# Patient Record
Sex: Female | Born: 1960
Health system: Southern US, Community
[De-identification: ages and names within clinical notes are randomized; demographics above are authoritative.]

## PROBLEM LIST (undated history)

## (undated) DIAGNOSIS — K219 Gastro-esophageal reflux disease without esophagitis: Secondary | ICD-10-CM

## (undated) DIAGNOSIS — T7840XA Allergy, unspecified, initial encounter: Secondary | ICD-10-CM

## (undated) HISTORY — PX: UTERINE FIBROID SURGERY: SHX826

## (undated) HISTORY — DX: Allergy, unspecified, initial encounter: T78.40XA

---

## 1998-12-23 ENCOUNTER — Other Ambulatory Visit: Admission: RE | Admit: 1998-12-23 | Discharge: 1998-12-23 | Payer: Self-pay | Admitting: Gynecology

## 1999-12-10 ENCOUNTER — Other Ambulatory Visit: Admission: RE | Admit: 1999-12-10 | Discharge: 1999-12-10 | Payer: Self-pay | Admitting: Gynecology

## 2000-02-19 ENCOUNTER — Encounter: Admission: RE | Admit: 2000-02-19 | Discharge: 2000-02-19 | Payer: Self-pay | Admitting: Gynecology

## 2000-02-19 ENCOUNTER — Encounter: Payer: Self-pay | Admitting: Gynecology

## 2001-03-03 ENCOUNTER — Other Ambulatory Visit: Admission: RE | Admit: 2001-03-03 | Discharge: 2001-03-03 | Payer: Self-pay | Admitting: Gynecology

## 2001-04-21 ENCOUNTER — Encounter: Payer: Self-pay | Admitting: Gynecology

## 2001-04-21 ENCOUNTER — Ambulatory Visit (HOSPITAL_COMMUNITY): Admission: RE | Admit: 2001-04-21 | Discharge: 2001-04-21 | Payer: Self-pay | Admitting: Gynecology

## 2001-04-28 ENCOUNTER — Encounter: Payer: Self-pay | Admitting: Gynecology

## 2001-04-28 ENCOUNTER — Encounter: Admission: RE | Admit: 2001-04-28 | Discharge: 2001-04-28 | Payer: Self-pay | Admitting: Gynecology

## 2002-03-22 ENCOUNTER — Other Ambulatory Visit: Admission: RE | Admit: 2002-03-22 | Discharge: 2002-03-22 | Payer: Self-pay | Admitting: Gynecology

## 2002-05-11 ENCOUNTER — Encounter: Admission: RE | Admit: 2002-05-11 | Discharge: 2002-05-11 | Payer: Self-pay | Admitting: Gynecology

## 2002-05-11 ENCOUNTER — Encounter: Payer: Self-pay | Admitting: Gynecology

## 2003-05-02 ENCOUNTER — Other Ambulatory Visit: Admission: RE | Admit: 2003-05-02 | Discharge: 2003-05-02 | Payer: Self-pay | Admitting: Gynecology

## 2003-05-31 ENCOUNTER — Encounter: Payer: Self-pay | Admitting: Gynecology

## 2003-05-31 ENCOUNTER — Encounter: Admission: RE | Admit: 2003-05-31 | Discharge: 2003-05-31 | Payer: Self-pay | Admitting: Gynecology

## 2004-07-02 ENCOUNTER — Other Ambulatory Visit: Admission: RE | Admit: 2004-07-02 | Discharge: 2004-07-02 | Payer: Self-pay | Admitting: Gynecology

## 2004-08-14 ENCOUNTER — Encounter: Admission: RE | Admit: 2004-08-14 | Discharge: 2004-08-14 | Payer: Self-pay | Admitting: Gynecology

## 2005-10-15 ENCOUNTER — Encounter: Admission: RE | Admit: 2005-10-15 | Discharge: 2005-10-15 | Payer: Self-pay | Admitting: Gynecology

## 2005-11-25 ENCOUNTER — Other Ambulatory Visit: Admission: RE | Admit: 2005-11-25 | Discharge: 2005-11-25 | Payer: Self-pay | Admitting: Gynecology

## 2006-12-16 ENCOUNTER — Encounter: Admission: RE | Admit: 2006-12-16 | Discharge: 2006-12-16 | Payer: Self-pay | Admitting: Gynecology

## 2007-04-06 ENCOUNTER — Other Ambulatory Visit: Admission: RE | Admit: 2007-04-06 | Discharge: 2007-04-06 | Payer: Self-pay | Admitting: Gynecology

## 2008-02-22 ENCOUNTER — Encounter: Admission: RE | Admit: 2008-02-22 | Discharge: 2008-02-22 | Payer: Self-pay | Admitting: Gynecology

## 2008-06-13 ENCOUNTER — Other Ambulatory Visit: Admission: RE | Admit: 2008-06-13 | Discharge: 2008-06-13 | Payer: Self-pay | Admitting: Gynecology

## 2008-09-20 ENCOUNTER — Encounter: Admission: RE | Admit: 2008-09-20 | Discharge: 2008-09-20 | Payer: Self-pay | Admitting: Internal Medicine

## 2008-09-20 ENCOUNTER — Ambulatory Visit: Payer: Self-pay | Admitting: Internal Medicine

## 2008-10-03 ENCOUNTER — Ambulatory Visit (HOSPITAL_COMMUNITY): Admission: RE | Admit: 2008-10-03 | Discharge: 2008-10-03 | Payer: Self-pay | Admitting: Obstetrics and Gynecology

## 2008-10-03 ENCOUNTER — Encounter (INDEPENDENT_AMBULATORY_CARE_PROVIDER_SITE_OTHER): Payer: Self-pay | Admitting: Obstetrics and Gynecology

## 2009-02-28 ENCOUNTER — Encounter: Admission: RE | Admit: 2009-02-28 | Discharge: 2009-02-28 | Payer: Self-pay | Admitting: Obstetrics and Gynecology

## 2009-06-20 ENCOUNTER — Ambulatory Visit: Payer: Self-pay | Admitting: Internal Medicine

## 2009-09-06 ENCOUNTER — Ambulatory Visit: Payer: Self-pay | Admitting: Internal Medicine

## 2009-10-23 ENCOUNTER — Ambulatory Visit: Payer: Self-pay | Admitting: Internal Medicine

## 2010-03-06 ENCOUNTER — Encounter: Admission: RE | Admit: 2010-03-06 | Discharge: 2010-03-06 | Payer: Self-pay | Admitting: Obstetrics and Gynecology

## 2010-09-27 ENCOUNTER — Encounter: Payer: Self-pay | Admitting: Gynecology

## 2010-10-16 ENCOUNTER — Encounter: Payer: Self-pay | Admitting: Internal Medicine

## 2010-11-27 ENCOUNTER — Encounter (INDEPENDENT_AMBULATORY_CARE_PROVIDER_SITE_OTHER): Payer: BC Managed Care – PPO | Admitting: Internal Medicine

## 2010-11-27 DIAGNOSIS — Z0000 Encounter for general adult medical examination without abnormal findings: Secondary | ICD-10-CM

## 2010-12-04 ENCOUNTER — Other Ambulatory Visit: Payer: Self-pay | Admitting: Obstetrics and Gynecology

## 2010-12-21 LAB — URINALYSIS, ROUTINE W REFLEX MICROSCOPIC
Glucose, UA: NEGATIVE mg/dL
Ketones, ur: NEGATIVE mg/dL
Leukocytes, UA: NEGATIVE
Protein, ur: NEGATIVE mg/dL
Urobilinogen, UA: 0.2 mg/dL (ref 0.0–1.0)
pH: 6 (ref 5.0–8.0)

## 2010-12-21 LAB — PREGNANCY, URINE: Preg Test, Ur: NEGATIVE

## 2011-01-19 NOTE — Op Note (Signed)
Erin Fernandez, Erin Fernandez               ACCOUNT NO.:  [id number]   MEDICAL RECORD NO.:  [id number]          PATIENT TYPE:  AMB   LOCATION:  [facility]                           FACILITY:  [facility]   PHYSICIAN:  Randye Lobo, M.D.   DATE OF BIRTH:  1960/12/21   DATE OF PROCEDURE:  10/03/2008  DATE OF DISCHARGE:                               OPERATIVE REPORT   PREOPERATIVE DIAGNOSES:  1. Menorrhagia.  2. Intracavitary polyps versus fibroids.   POSTOPERATIVE DIAGNOSES:  1. Menorrhagia.  2. Polypoid endometrium.   PROCEDURE:  Hysteroscopy, dilation and curettage, endometrial  polypectomy, NovaSure endometrial ablation.   SURGEON:  Randye Lobo, MD   IV FLUIDS:  1400 mL of Ringer lactate.   ESTIMATED BLOOD LOSS:  Minimal.   <INTRACAVITARY RINGERS LACTATE DEFICIT>  90 cc.   COMPLICATIONS:  None.   INDICATIONS FOR PROCEDURE:  The patient is a 50 year old gravida 1, para  1, Caucasian female who presented with a complaint of heavy  menstruation.  The patient was referred by Dr. Teodora Medici, after he  performed a saline ultrasound in the office documenting 2 intracavitary  areas measuring 1.2 cm each which were consistent with either polyps or  submucous fibroids.  The patient underwent an endometrial biopsy on  July 22, 2008 in my office and this documented benign secretory  endometrium.  The patient desires removal of the intracavitary masses  and she also desires a NovaSure endometrial ablation and she declines  any future childbearing.  Risks, benefits, and alternatives of the  procedure have been reviewed with the patient, who wishes to proceed.   FINDINGS:  Examination under anesthesia revealed a small anteverted  mobile uterus.  No adnexal masses were appreciated.   Hysteroscopy demonstrated a very polypoid endometrium with several areas  that appeared to be potential intracavitary defects seen on the prior  saline ultrasound.  The right tubal ostial region was visualized and  the  left could not be visualized well.  The cervix demonstrated no polyps.   SPECIMENS:  The endometrial curettings and the polypoid endometrial  tissue were all sent to Pathology together.   PROCEDURE:  The patient was re-identified in the preoperative hold area.  She received ciprofloxacin 900 mg IV for antibiotic prophylaxis.   In the operating room, the patient received her LMA anesthetic and was  then placed in the dorsal lithotomy position.  The lower abdomen and  vagina and perineal region were then sterilely prepped.  The bladder was  catheterized of urine.  The patient was then sterilely draped.   An exam under anesthesia was performed.   A speculum was placed in the vagina and a single tooth tenaculum was  placed on the anterior cervical lip.  The internal os of the cervix was  sounded to 3.7 cm.  The uterine cavity was sounded to a total of 8.5 cm.  The cervix was then dilated up to a #21 Pratt dilator.  The diagnostic  hysteroscope was then inserted into the uterine cavity under the  continuous infusion of lactated Ringers solution.  The findings were as  noted  above.  The cervix was further dilated to a #25 Pratt dilator and  a serrated curette was used to curette the endometrial in all 4  quadrants.  The tissue specimen was removed with the serrated curette  and with the assistance of a polyp forceps.  The resectoscope was  reinserted and there was a large amount of tissue was remaining and the  cervix was therefore further dilated to a #31 Pratt dilator, so that the  resectoscope could be used.  The resectoscope was placed in the uterine  cavity without difficulty and the remaining polypoid endometrium was  resected using monopolar cautery on the resectoscopic loop.  The  remaining specimen pieces were then adequately removed and set aside for  pathologic evaluation.   The endometrial ablation was performed next.  The NovaSure device was  placed inside the uterine  cavity to the level of the uterine fundus and  withdrawn slightly.  The NovaSure was opened.  The NovaSure device was  maneuvered to appropriately measure the cavity width, which was 2.7 cm.  The obturator was placed against the cervical os and the CO2 test was  performed and the patient passed.  The NovaSure device was then  activated and the endometrium was ablated.  The NovaSure device was then  removed from within the uterine cavity.  There was no active bleeding  from the cervix after the tenaculum and the NovaSure device were  removed.   This concluded the patient's procedure.  She was awakened and escorted  to the recovery room in stable condition.  There were no complications.  All needle, instrument, and sponge counts were correct.      Randye Lobo, M.D.  Electronically Signed     BES/MEDQ  D:  10/03/2008  T:  10/04/2008  Job:  [id number]   cc:   Leatha Gilding. Mezer, M.D.  Fax: [phone]

## 2011-02-18 ENCOUNTER — Encounter: Payer: Self-pay | Admitting: Internal Medicine

## 2011-02-19 ENCOUNTER — Encounter: Payer: Self-pay | Admitting: Internal Medicine

## 2011-02-19 ENCOUNTER — Ambulatory Visit (INDEPENDENT_AMBULATORY_CARE_PROVIDER_SITE_OTHER): Payer: BC Managed Care – PPO | Admitting: Internal Medicine

## 2011-02-19 VITALS — BP 128/84 | HR 76 | Temp 98.4°F | Ht 66.0 in | Wt 133.0 lb

## 2011-02-19 DIAGNOSIS — R1013 Epigastric pain: Secondary | ICD-10-CM

## 2011-02-19 LAB — CBC WITH DIFFERENTIAL/PLATELET
Eosinophils Absolute: 0.2 10*3/uL (ref 0.0–0.7)
Eosinophils Relative: 4 % (ref 0–5)
HCT: 42.9 % (ref 36.0–46.0)
Hemoglobin: 14.7 g/dL (ref 12.0–15.0)
Lymphocytes Relative: 32 % (ref 12–46)
MCHC: 34.3 g/dL (ref 30.0–36.0)
Monocytes Absolute: 0.6 10*3/uL (ref 0.1–1.0)
Monocytes Relative: 9 % (ref 3–12)
Neutro Abs: 3.6 10*3/uL (ref 1.7–7.7)
RDW: 13.3 % (ref 11.5–15.5)

## 2011-02-19 NOTE — Patient Instructions (Signed)
Follow bland diet for several days. Avoid NSAIDS and ETOH for now. Take Protonix as directed. Plan colonoscopy (screening) for fall and possible endoscopy if needed. Await lab results.

## 2011-02-19 NOTE — Progress Notes (Signed)
  Subjective:    Patient ID: Erin Fernandez, female    DOB: [birth year], 50 y.o.   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 No melena, BRBPR, no hematemesis. No waterbrash or burping. Took Prilosec for about 10 days without much relief. Switched to Zantac 150 mg with better relief. 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 Some better this week. Has been avoiding alcohol lately. Nonsmoker. Pain noted to be worse after eating and as the day progressed. Feels pretty well in am. Not awakened with pain at night. Pt gives hx of gluten sensitivity but sprue panel was negative. Father has Ulcerative Colitis and she is due for a colonoscopy in the fall. No unusual stress.    Review of Systems     Objective:   Physical Exam  Abdominal: Soft. Bowel sounds are normal. She exhibits no distension and no mass. There is tenderness. There is no guarding.       Epigastrium without rebound.  Genitourinary: Guaiac negative stool.          Assessment & Plan:  Gastritis vs Peptic Ulcer disease. Plan is to check CBC and H.pylori antibody. Rx: Protonix 40mg  daily for 30 days if H. Pylori is negative. Avoid NSAIDS. Decrease ETOH consumption. Avoid eating late at night and spicy foods.

## 2011-02-24 ENCOUNTER — Encounter: Payer: Self-pay | Admitting: Internal Medicine

## 2011-02-25 LAB — HELICOBACTER PYLORI ABS-IGG+IGA, BLD: HELICOBACTER PYLORI AB, IGA: 6 U/mL (ref <9.0)

## 2011-05-26 ENCOUNTER — Other Ambulatory Visit: Payer: Self-pay | Admitting: Obstetrics and Gynecology

## 2011-05-26 DIAGNOSIS — Z1231 Encounter for screening mammogram for malignant neoplasm of breast: Secondary | ICD-10-CM

## 2011-06-25 ENCOUNTER — Ambulatory Visit
Admission: RE | Admit: 2011-06-25 | Discharge: 2011-06-25 | Disposition: A | Discharge status: Home / Self Care | Payer: BC Managed Care – PPO | Source: Ambulatory Visit | Attending: Obstetrics and Gynecology | Admitting: Obstetrics and Gynecology

## 2011-06-25 DIAGNOSIS — Z1231 Encounter for screening mammogram for malignant neoplasm of breast: Secondary | ICD-10-CM

## 2011-08-06 ENCOUNTER — Ambulatory Visit
Admission: RE | Admit: 2011-08-06 | Discharge: 2011-08-06 | Disposition: A | Discharge status: Home / Self Care | Payer: BC Managed Care – PPO | Source: Ambulatory Visit | Attending: Gastroenterology | Admitting: Gastroenterology

## 2011-08-06 ENCOUNTER — Other Ambulatory Visit: Payer: Self-pay | Admitting: Gastroenterology

## 2011-08-06 DIAGNOSIS — R1013 Epigastric pain: Secondary | ICD-10-CM

## 2011-08-18 ENCOUNTER — Other Ambulatory Visit: Payer: Self-pay | Admitting: Internal Medicine

## 2011-08-18 DIAGNOSIS — N2889 Other specified disorders of kidney and ureter: Secondary | ICD-10-CM

## 2011-08-18 DIAGNOSIS — R1013 Epigastric pain: Secondary | ICD-10-CM

## 2011-08-18 NOTE — Progress Notes (Signed)
Contacted by patient today who has been having abdominal pain evaluated by Dr. Kinnie Scales. A right renal mass was noted on ultrasound in May measuring 5x4x23mm. Repeat study 08/06/11 shows same mass hypoechoic measuring 9x6x50mm. Radiologist mentions possibility of renal cell carcinoma and recommends MRI with and without contrast. Attempts schedule MRI of the abdomen with and without contrast for further evaluation. Patient is quite worried. Recent urinalysis showed 3-6 red blood cells per high powered field. CBC was normal. We do not have a recent basic metabolic panel with kidney functions for contrast administration

## 2011-08-19 ENCOUNTER — Other Ambulatory Visit: Payer: BC Managed Care – PPO | Admitting: Internal Medicine

## 2011-08-19 DIAGNOSIS — R1013 Epigastric pain: Secondary | ICD-10-CM

## 2011-08-20 ENCOUNTER — Ambulatory Visit (HOSPITAL_COMMUNITY)
Admission: RE | Admit: 2011-08-20 | Discharge: 2011-08-20 | Disposition: A | Discharge status: Home / Self Care | Payer: BC Managed Care – PPO | Source: Ambulatory Visit | Attending: Internal Medicine | Admitting: Internal Medicine

## 2011-08-20 DIAGNOSIS — N281 Cyst of kidney, acquired: Secondary | ICD-10-CM | POA: Insufficient documentation

## 2011-08-20 DIAGNOSIS — N2889 Other specified disorders of kidney and ureter: Secondary | ICD-10-CM

## 2011-08-20 DIAGNOSIS — R9389 Abnormal findings on diagnostic imaging of other specified body structures: Secondary | ICD-10-CM | POA: Insufficient documentation

## 2011-08-20 LAB — COMPLETE METABOLIC PANEL WITH GFR
ALT: 8 U/L (ref 0–35)
AST: 13 U/L (ref 0–37)
CO2: 22 mEq/L (ref 19–32)
Creat: 0.66 mg/dL (ref 0.50–1.10)
GFR, Est Non African American: >89 mL/min
Glucose, Bld: 80 mg/dL (ref 70–99)
Potassium: 4.1 mEq/L (ref 3.5–5.3)
Sodium: 141 mEq/L (ref 135–145)
Total Bilirubin: 0.6 mg/dL (ref 0.3–1.2)
Total Protein: 6.7 g/dL (ref 6.0–8.3)

## 2011-08-20 MED ORDER — GADOBENATE DIMEGLUMINE 529 MG/ML IV SOLN
12.0000 mL | Freq: Once | INTRAVENOUS | Status: AC | PRN
  Administered 2011-08-20: 12 mL via INTRAVENOUS

## 2011-08-21 ENCOUNTER — Telehealth: Payer: Self-pay | Admitting: Internal Medicine

## 2011-08-21 NOTE — Telephone Encounter (Signed)
MRI shows benign lesion in kidney. Pt informed today via phone. Copy of report mailed to pt also.

## 2012-06-20 ENCOUNTER — Other Ambulatory Visit: Payer: Self-pay | Admitting: Internal Medicine

## 2012-06-20 DIAGNOSIS — Z1231 Encounter for screening mammogram for malignant neoplasm of breast: Secondary | ICD-10-CM

## 2012-07-21 ENCOUNTER — Ambulatory Visit
Admission: RE | Admit: 2012-07-21 | Discharge: 2012-07-21 | Disposition: A | Discharge status: Home / Self Care | Payer: BC Managed Care – PPO | Source: Ambulatory Visit | Attending: Internal Medicine | Admitting: Internal Medicine

## 2012-07-21 DIAGNOSIS — Z1231 Encounter for screening mammogram for malignant neoplasm of breast: Secondary | ICD-10-CM

## 2012-08-13 ENCOUNTER — Encounter (HOSPITAL_COMMUNITY): Payer: Self-pay | Admitting: *Deleted

## 2012-08-13 ENCOUNTER — Emergency Department (HOSPITAL_COMMUNITY)
Admission: EM | Admit: 2012-08-13 | Discharge: 2012-08-13 | Disposition: A | Discharge status: Home / Self Care | Payer: BC Managed Care – PPO | Source: Home / Self Care | Attending: Emergency Medicine | Admitting: Emergency Medicine

## 2012-08-13 DIAGNOSIS — T148XXA Other injury of unspecified body region, initial encounter: Secondary | ICD-10-CM

## 2012-08-13 DIAGNOSIS — IMO0002 Reserved for concepts with insufficient information to code with codable children: Secondary | ICD-10-CM

## 2012-08-13 DIAGNOSIS — Z23 Encounter for immunization: Secondary | ICD-10-CM

## 2012-08-13 HISTORY — DX: Gastro-esophageal reflux disease without esophagitis: K21.9

## 2012-08-13 MED ORDER — TETANUS-DIPHTH-ACELL PERTUSSIS 5-2.5-18.5 LF-MCG/0.5 IM SUSP
INTRAMUSCULAR | Status: AC
  Filled 2012-08-13: qty 0.5

## 2012-08-13 MED ORDER — BACITRACIN 500 UNIT/GM EX OINT
1.0000 "application " | TOPICAL_OINTMENT | Freq: Once | CUTANEOUS | Status: AC
  Administered 2012-08-13: 1 via TOPICAL

## 2012-08-13 MED ORDER — TETANUS-DIPHTH-ACELL PERTUSSIS 5-2.5-18.5 LF-MCG/0.5 IM SUSP
0.5000 mL | Freq: Once | INTRAMUSCULAR | Status: AC
  Administered 2012-08-13: 0.5 mL via INTRAMUSCULAR

## 2012-08-13 NOTE — ED Notes (Signed)
Reports laceration to hand between right 4th & 5th fingers @ approx 1730 tonight from broken mason jar edge.  No active bleeding.  Took 600mg  Advil - denies any pain now.

## 2012-08-13 NOTE — ED Provider Notes (Signed)
Chief Complaint  Patient presents with  . 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 Bleeding was controlled. There is no numbness or tingling. She has a full range of motion of all joints. Her last tetanus shot was about 6 years ago, but there may have been some rust on the ring of the jelly jar. She does not think there is any glass foreign body in the wound.  Review of Systems:  Other than noted above, the patient denies any of the following symptoms: Systemic:  No fever or chills. Musculoskeletal:  No joint pain or decreased range of motion. Neuro:  No numbness, tingling, or weakness.  PMFSH:  Past medical history, family history, social history, meds, and allergies were reviewed.  Physical Exam:   Vital signs:  BP 151/89  Pulse 72  Temp 98.7 F (37.1 C) (Oral)  Resp 16  SpO2 98% Ext:  There is a 1 cm laceration in the webspace between the little finger and the ring finger of the right hand. All joints have full range of motion. Sensation is intact. Flexor and extensor mechanisms are intact.  All joints had a full ROM without pain.  Pulses were full.  Good capillary refill in all digits.  No edema. Neurological:  Alert and oriented.  No muscle weakness.  Sensation was intact to light touch.   Procedure: Verbal informed consent was obtained.  The patient was informed of the risks and benefits of the procedure and understands and accepts.  Identity of the patient was verified verbally and by wristband.   The laceration area described above was prepped with Betadine and saline  and anesthetized with 3 mL of 2% Xylocaine without.  The wound was then closed as follows:  Wound edges were approximated with 4 6-0 Prolene sutures.  There were no immediate complications, and the patient tolerated the procedure  well. The laceration was then cleansed, Bacitracin ointment was applied and a clean, dry pressure dressing was put on.   Medications given in [facility]:  She was given a Tdap vaccine.  Assessment:  The encounter diagnosis was Laceration.  Plan:   1.  The following meds were prescribed:   New Prescriptions   No medications on file   2.  The patient was instructed in wound care and pain control, and handouts were given. 3.  The patient was told to return in 14 days for suture removal or wound recheck or sooner if any sign of infection.     Reuben Likes, MD 08/13/12 2018

## 2013-04-13 ENCOUNTER — Encounter: Payer: BC Managed Care – PPO | Admitting: Internal Medicine

## 2013-04-13 ENCOUNTER — Other Ambulatory Visit: Payer: Self-pay | Admitting: Internal Medicine

## 2013-06-08 ENCOUNTER — Encounter: Payer: Self-pay | Admitting: Internal Medicine

## 2013-06-08 ENCOUNTER — Other Ambulatory Visit: Payer: BC Managed Care – PPO | Admitting: Internal Medicine

## 2013-06-08 ENCOUNTER — Ambulatory Visit (INDEPENDENT_AMBULATORY_CARE_PROVIDER_SITE_OTHER): Payer: BC Managed Care – PPO | Admitting: Internal Medicine

## 2013-06-08 ENCOUNTER — Other Ambulatory Visit (HOSPITAL_COMMUNITY)
Admission: RE | Admit: 2013-06-08 | Discharge: 2013-06-08 | Disposition: A | Discharge status: Home / Self Care | Payer: BC Managed Care – PPO | Source: Ambulatory Visit | Attending: Internal Medicine | Admitting: Internal Medicine

## 2013-06-08 VITALS — BP 136/90 | HR 72 | Temp 99.1°F | Ht 66.25 in | Wt 134.0 lb

## 2013-06-08 DIAGNOSIS — Z1329 Encounter for screening for other suspected endocrine disorder: Secondary | ICD-10-CM

## 2013-06-08 DIAGNOSIS — Z23 Encounter for immunization: Secondary | ICD-10-CM

## 2013-06-08 DIAGNOSIS — Z01419 Encounter for gynecological examination (general) (routine) without abnormal findings: Secondary | ICD-10-CM | POA: Insufficient documentation

## 2013-06-08 DIAGNOSIS — Z0000 Encounter for general adult medical examination without abnormal findings: Secondary | ICD-10-CM

## 2013-06-08 DIAGNOSIS — Z13220 Encounter for screening for lipoid disorders: Secondary | ICD-10-CM

## 2013-06-08 DIAGNOSIS — K219 Gastro-esophageal reflux disease without esophagitis: Secondary | ICD-10-CM

## 2013-06-08 DIAGNOSIS — Z130 Encounter for screening for diseases of the blood and blood-forming organs and certain disorders involving the immune mechanism: Secondary | ICD-10-CM

## 2013-06-08 LAB — CBC WITH DIFFERENTIAL/PLATELET
Eosinophils Absolute: 0.2 10*3/uL (ref 0.0–0.7)
Hemoglobin: 14.1 g/dL (ref 12.0–15.0)
MCH: 30.9 pg (ref 26.0–34.0)
Monocytes Relative: 9 % (ref 3–12)
Neutro Abs: 3.5 10*3/uL (ref 1.7–7.7)
Neutrophils Relative %: 56 % (ref 43–77)
Platelets: 220 10*3/uL (ref 150–400)
RBC: 4.57 MIL/uL (ref 3.87–5.11)
RDW: 13.7 % (ref 11.5–15.5)

## 2013-06-08 LAB — POCT URINALYSIS DIPSTICK
Glucose, UA: NEGATIVE
Ketones, UA: NEGATIVE
Protein, UA: NEGATIVE
Spec Grav, UA: <=1.005
Urobilinogen, UA: NEGATIVE

## 2013-06-08 LAB — COMPREHENSIVE METABOLIC PANEL
ALT: 9 U/L (ref 0–35)
AST: 14 U/L (ref 0–37)
Albumin: 4.2 g/dL (ref 3.5–5.2)
Glucose, Bld: 79 mg/dL (ref 70–99)
Potassium: 4.2 mEq/L (ref 3.5–5.3)
Sodium: 137 mEq/L (ref 135–145)

## 2013-06-08 LAB — LIPID PANEL
Cholesterol: 174 mg/dL (ref 0–200)
Triglycerides: 76 mg/dL (ref <150)

## 2013-06-08 LAB — TSH: TSH: 1.552 u[IU]/mL (ref 0.350–4.500)

## 2013-06-08 NOTE — Patient Instructions (Addendum)
Return in one year. May take Nexium OTC 2 capsules daily.

## 2013-06-09 LAB — URINALYSIS, ROUTINE W REFLEX MICROSCOPIC
Bilirubin Urine: NEGATIVE
Glucose, UA: NEGATIVE mg/dL
Leukocytes, UA: NEGATIVE
Protein, ur: NEGATIVE mg/dL
Urobilinogen, UA: 0.2 mg/dL (ref 0.0–1.0)
pH: 7 (ref 5.0–8.0)

## 2013-06-09 LAB — VITAMIN D 25 HYDROXY (VIT D DEFICIENCY, FRACTURES): Vit D, 25-Hydroxy: 52 ng/mL (ref 30–89)

## 2013-06-09 LAB — URINALYSIS, MICROSCOPIC ONLY
Casts: NONE SEEN
Squamous Epithelial / LPF: NONE SEEN

## 2013-07-07 NOTE — Progress Notes (Signed)
  Subjective:    Patient ID: Erin Fernandez, female    DOB: 1960/12/22, 52 y.o.   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 History of GE reflux. Dr. Kinnie Scales did colonoscopy 06/03/2011. Study was normal and followup was recommended in 10 years. She also had endoscopy at the same time for refractory reflux. Patient had complained previously that Protonix caused palpitations. Dr. Kinnie Scales recommended AcipHex. More recently patient has been taking 24-hour over-the-counter Nexium. Biopsies obtained by Dr. Kinnie Scales showed minimal focal active colitis. Duodenal biopsy showed mild chronic duodenitis. No evidence of sprue. Rectal biopsy showed benign colonic mucosa. In may 2012 she had renal ultrasound and a hyperechoic lesion noted in the right kidney was noted. 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 She was evaluated at the time by Dr. Patsi Sears for microhematuria.  Past medical history: Patient is allergic penicillin and sulfa-both cause rashes.  Patient had endometrial polypectomy 2010.  Nonsmoker. Social alcohol consumption.  Social history: Building surveyor. One daughter from previous marriage who is in Social worker school in [place]. This is her second marriage. She is married to Bubba Camp, DDS.  Family history: father and brother with hypertension. Father with history of colitis and pacemaker. Mother in good health. Sister in good health.    Review of Systems  Constitutional: Negative.   All other systems reviewed and are negative.       Objective:   Physical Exam  Vitals reviewed. Constitutional: She is oriented to person, place, and time. She appears well-developed and well-nourished. No distress.  HENT:  Head: Normocephalic and atraumatic.  Right Ear: External ear normal.  Left Ear: External ear normal.  Nose: Nose normal.  Mouth/Throat: Oropharynx  is clear and moist. No oropharyngeal exudate.  Eyes: Conjunctivae and EOM are normal. Right eye exhibits no discharge. Left eye exhibits no discharge.  Neck: Neck supple. No JVD present. No thyromegaly present.  Cardiovascular: Normal rate, regular rhythm, normal heart sounds and intact distal pulses.   No murmur heard. Pulmonary/Chest: Effort normal and breath sounds normal. She has no wheezes. She has no rales.  Breasts normal female  Abdominal: Soft. Bowel sounds are normal. She exhibits no distension and no mass. There is no tenderness. There is no rebound and no guarding.  Genitourinary:  Bimanual normal. Pap taken.  Musculoskeletal: Normal range of motion. She exhibits no edema.  Lymphadenopathy:    She has no cervical adenopathy.  Neurological: She is alert and oriented to person, place, and time. She has normal reflexes. No cranial nerve deficit. Coordination normal.  Skin: Skin is warm and dry. No rash noted. She is not diaphoretic.  Psychiatric: She has a normal mood and affect. Her behavior is normal. Judgment and thought content normal.          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 Patsi Sears  History of minimal focal active colitis of cecum 2012 currently not being treated  History of duodenitis with biopsy 2012  Plan: Return in one year or as needed. Continue over-the-counter Nexium for GE reflux symptoms.

## 2013-07-23 ENCOUNTER — Other Ambulatory Visit: Payer: Self-pay

## 2013-07-23 DIAGNOSIS — Z1231 Encounter for screening mammogram for malignant neoplasm of breast: Secondary | ICD-10-CM

## 2013-08-24 ENCOUNTER — Ambulatory Visit
Admission: RE | Admit: 2013-08-24 | Discharge: 2013-08-24 | Disposition: A | Discharge status: Home / Self Care | Payer: BC Managed Care – PPO | Source: Ambulatory Visit

## 2013-08-24 DIAGNOSIS — Z1231 Encounter for screening mammogram for malignant neoplasm of breast: Secondary | ICD-10-CM

## 2013-10-11 ENCOUNTER — Ambulatory Visit (INDEPENDENT_AMBULATORY_CARE_PROVIDER_SITE_OTHER): Payer: BC Managed Care – PPO | Admitting: Internal Medicine

## 2013-10-11 ENCOUNTER — Encounter: Payer: Self-pay | Admitting: Internal Medicine

## 2013-10-11 VITALS — BP 134/88 | HR 72 | Temp 98.8°F | Wt 135.0 lb

## 2013-10-11 DIAGNOSIS — IMO0001 Reserved for inherently not codable concepts without codable children: Secondary | ICD-10-CM

## 2013-10-11 DIAGNOSIS — R2230 Localized swelling, mass and lump, unspecified upper limb: Secondary | ICD-10-CM

## 2013-10-11 DIAGNOSIS — R229 Localized swelling, mass and lump, unspecified: Secondary | ICD-10-CM

## 2013-10-11 NOTE — Progress Notes (Signed)
   Subjective:    Patient ID: Erin Fernandez, female    DOB: Nov 10, 1960, 53 y.o.   MRN: [id number]  HPI Patient had normal mammogram November 2014. Over the past few weeks has noticed an oblong mass left ankle elevated is tender. No recent illnesses. Patient is worried and concerned.    Review of Systems     Objective:   Physical Exam  1.5 cm oblong mass lower left axilla. She has thickening in left upper outer quadrant of left breast. Says that this is chronic.      Assessment & Plan:  Axillary mass  Plan: Refer to surgeon for evaluation.

## 2013-10-11 NOTE — Patient Instructions (Signed)
Refer to surgeon for evaluation 

## 2013-10-12 ENCOUNTER — Telehealth (INDEPENDENT_AMBULATORY_CARE_PROVIDER_SITE_OTHER): Payer: Self-pay

## 2013-10-12 ENCOUNTER — Ambulatory Visit (INDEPENDENT_AMBULATORY_CARE_PROVIDER_SITE_OTHER): Payer: BC Managed Care – PPO | Admitting: General Surgery

## 2013-10-12 ENCOUNTER — Encounter (INDEPENDENT_AMBULATORY_CARE_PROVIDER_SITE_OTHER): Payer: Self-pay | Admitting: General Surgery

## 2013-10-12 VITALS — BP 124/70 | HR 77 | Temp 98.0°F | Resp 18 | Ht 66.0 in | Wt 135.0 lb

## 2013-10-12 DIAGNOSIS — R229 Localized swelling, mass and lump, unspecified: Secondary | ICD-10-CM

## 2013-10-12 DIAGNOSIS — R2230 Localized swelling, mass and lump, unspecified upper limb: Secondary | ICD-10-CM

## 2013-10-12 NOTE — Telephone Encounter (Signed)
Patient called back and was given below message.  Patient states understanding. 

## 2013-10-12 NOTE — Progress Notes (Signed)
Patient ID: Erin Fernandez, female   DOB: 05/07/1961, 53 y.o.   MRN: [id number]  Chief Complaint  Patient presents with  . New Evaluation    Left axillia    HPI Erin Fernandez is a 53 y.o. female.   HPI Patient is a 54 year old female who has palpated a mass in her left axilla around 3-4 days ago. She states that this has been quite sore.  She denies any trauma to her upper extremity. She has no history of breast cancer. She denies any breast complaints. She did have her routine screening mammogram in November which was negative. She denies any drainage or redness. She states the pain is noticeable, but not horrible.  She had a bad cold over the Christmas holiday, but has felt well for quite some time.  Past Medical History  Diagnosis Date  . GERD (gastroesophageal reflux disease)     Past Surgical History  Procedure Laterality Date  . Uterine fibroid surgery      Family History  Problem Relation Age of Onset  . Hypertension Father   . Cholecystitis Father   . Hypertension Brother     Social History History  Substance Use Topics  . Smoking status: Never Smoker   . Smokeless tobacco: Never Used  . Alcohol Use: 0.0 oz/week     Comment: wine on weekends    Allergies  Allergen Reactions  . Penicillins Rash  . Sulfa Antibiotics Rash    Current Outpatient Prescriptions  Medication Sig Dispense Refill  . Calcium Citrate (CITRACAL PO) Take by mouth daily.        . Cholecalciferol (VITAMIN D PO) Take by mouth daily.        Marland Kitchen esomeprazole (NEXIUM) 20 MG capsule Take 20 mg by mouth daily at 12 noon.      . Esomeprazole Magnesium (NEXIUM 24HR PO) Take by mouth.      . Omega-3 Fatty Acids (OMEGA 3 PO) Take by mouth daily.         No current facility-administered medications for this visit.    Review of Systems Review of Systems  All other systems reviewed and are negative.    Blood pressure 124/70, pulse 77, temperature 98 F (36.7 C), resp. rate 18, height 5\' 6"   (1.676 m), weight 135 lb (61.236 kg).  Physical Exam Physical Exam  Constitutional: She is oriented to person, place, and time. She appears well-developed and well-nourished. No distress.  HENT:  Head: Normocephalic and atraumatic.  Eyes: Conjunctivae are normal. Pupils are equal, round, and reactive to light. Right eye exhibits no discharge. Left eye exhibits no discharge. No scleral icterus.  Neck: Normal range of motion. Neck supple. No JVD present. No tracheal deviation present. No thyromegaly present.  Cardiovascular: Normal rate, regular rhythm and intact distal pulses.   Pulmonary/Chest: Effort normal. No respiratory distress. She exhibits mass. Right breast exhibits no inverted nipple, no mass, no nipple discharge, no skin change and no tenderness. Left breast exhibits no inverted nipple, no mass, no nipple discharge, no skin change and no tenderness.    Small oval firm mass just behind the edge of the pectoralis on the left.  No other axillary adenopathy.  No palpable breast masses.  Relatively dense breast tissue bilaterally.    Abdominal: Soft. She exhibits no distension. There is no tenderness.  Lymphadenopathy:    She has no cervical adenopathy.  Neurological: She is alert and oriented to person, place, and time.  Skin: Skin is warm and dry.  No rash noted. She is not diaphoretic. No erythema. No pallor.  Psychiatric: She has a normal mood and affect. Her behavior is normal. Judgment and thought content normal.    Data Reviewed Mammogram IMPRESSION:  No mammographic evidence of malignancy. A result letter of this  screening mammogram will be mailed directly to the patient.   Assessment/Plan    Left Axillary mass This axillary mass feels like a small reactive lymph node.  I will get a diagnostic mammogram and ultrasound to evaluate this. If this does look like a benign reactive lymph node, I will still recheck her in 6-8 weeks to see if this has disappeared. It may  require surgical excision if it is not going on its own.  Once we get the results of the films, we will determine what time and to follow her up.     Meko Bellanger 10/12/2013, 1:32 PM

## 2013-10-12 NOTE — Assessment & Plan Note (Signed)
This axillary mass feels like a small reactive lymph node.  I will get a diagnostic mammogram and ultrasound to evaluate this. If this does look like a benign reactive lymph node, I will still recheck her in 6-8 weeks to see if this has disappeared. It may require surgical excision if it is not going on its own.  Once we get the results of the films, we will determine what time and to follow her up.

## 2013-10-12 NOTE — Patient Instructions (Signed)
We will get diagnostic mammogram and ultrasound.  Once we get result, will determine which direction to go in.  Even if it does not look suspicious, would recheck in 6-8 weeks.

## 2013-10-12 NOTE — Telephone Encounter (Signed)
Pt has mammogram and US scheduled at [facility] 11/01/13 at 3:15 p.m.

## 2013-11-01 ENCOUNTER — Other Ambulatory Visit: Payer: BC Managed Care – PPO

## 2013-11-08 ENCOUNTER — Ambulatory Visit
Admission: RE | Admit: 2013-11-08 | Discharge: 2013-11-08 | Disposition: A | Discharge status: Home / Self Care | Payer: BC Managed Care – PPO | Source: Ambulatory Visit | Attending: General Surgery | Admitting: General Surgery

## 2013-11-08 ENCOUNTER — Other Ambulatory Visit (INDEPENDENT_AMBULATORY_CARE_PROVIDER_SITE_OTHER): Payer: Self-pay | Admitting: General Surgery

## 2013-11-08 ENCOUNTER — Ambulatory Visit (INDEPENDENT_AMBULATORY_CARE_PROVIDER_SITE_OTHER): Payer: BC Managed Care – PPO | Admitting: Internal Medicine

## 2013-11-08 ENCOUNTER — Encounter: Payer: Self-pay | Admitting: Internal Medicine

## 2013-11-08 VITALS — BP 122/80 | HR 64 | Temp 98.9°F | Wt 135.5 lb

## 2013-11-08 DIAGNOSIS — H6503 Acute serous otitis media, bilateral: Secondary | ICD-10-CM

## 2013-11-08 DIAGNOSIS — R2230 Localized swelling, mass and lump, unspecified upper limb: Secondary | ICD-10-CM

## 2013-11-08 DIAGNOSIS — H6500 Acute serous otitis media, unspecified ear: Secondary | ICD-10-CM

## 2013-11-08 DIAGNOSIS — J019 Acute sinusitis, unspecified: Secondary | ICD-10-CM

## 2013-11-08 MED ORDER — HYDROCODONE-HOMATROPINE 5-1.5 MG/5ML PO SYRP: 5.0000 mL | ORAL_SOLUTION | Freq: Three times a day (TID) | ORAL | Status: DC | PRN

## 2013-11-08 MED ORDER — CLARITHROMYCIN 250 MG PO TABS: 250.0000 mg | ORAL_TABLET | Freq: Two times a day (BID) | ORAL | Status: DC

## 2013-11-08 NOTE — Progress Notes (Signed)
   Subjective:    Patient ID: Erin BreslowSandra L Fernandez, female    DOB: December 28, 1960, 53 y.o.   MRN: [id number]  HPI Patient recently went on a trip to [place]. While there, she came down with an upper respiratory infection. She flew home and subsequently developed a lot of discolored postnasal drip. More recently had low-grade fever up to 100, headache and sinus pressure. She started a Zithromax Z-Pak that she had on hand. Had some chills last evening. Hasn't felt well.  With regard to palpable mass left axilla, she is due for ultrasound which she's going to do this afternoon. She did see Dr. Donell BeersByerly for evaluation.    Review of Systems     Objective:   Physical Exam  Pharynx is clear. Both TMs are full but not red. Neck is supple without adenopathy. Chest clear to auscultation without rales or wheezing      Assessment & Plan:  Sinusitis  Bilateral serous otitis media  Plan: Biaxin 250 mg twice daily for 10 days. Hycodan 8 ounces 1 teaspoon by mouth every 8 hours when necessary cough. Discontinue Z-Pak

## 2013-11-08 NOTE — Patient Instructions (Signed)
Take Biaxin twice daily for 10 days. Discontinue Z-Pak. Take Hycodan if needed for cough. Call if not better in 2 weeks. May take over-the-counter decongestant for ear congestion

## 2013-11-12 ENCOUNTER — Encounter: Payer: Self-pay | Admitting: Internal Medicine

## 2013-11-12 ENCOUNTER — Ambulatory Visit (INDEPENDENT_AMBULATORY_CARE_PROVIDER_SITE_OTHER): Payer: BC Managed Care – PPO | Admitting: Internal Medicine

## 2013-11-12 ENCOUNTER — Telehealth: Payer: Self-pay | Admitting: Internal Medicine

## 2013-11-12 VITALS — BP 126/82 | HR 60 | Temp 98.4°F | Wt 135.0 lb

## 2013-11-12 DIAGNOSIS — R220 Localized swelling, mass and lump, head: Secondary | ICD-10-CM

## 2013-11-12 DIAGNOSIS — H6503 Acute serous otitis media, bilateral: Secondary | ICD-10-CM

## 2013-11-12 DIAGNOSIS — T50905A Adverse effect of unspecified drugs, medicaments and biological substances, initial encounter: Secondary | ICD-10-CM

## 2013-11-12 DIAGNOSIS — H6500 Acute serous otitis media, unspecified ear: Secondary | ICD-10-CM

## 2013-11-12 DIAGNOSIS — R221 Localized swelling, mass and lump, neck: Secondary | ICD-10-CM

## 2013-11-12 DIAGNOSIS — I10 Essential (primary) hypertension: Secondary | ICD-10-CM

## 2013-11-12 DIAGNOSIS — J0100 Acute maxillary sinusitis, unspecified: Secondary | ICD-10-CM

## 2013-11-12 MED ORDER — METHYLPREDNISOLONE ACETATE 80 MG/ML IJ SUSP
80.0000 mg | Freq: Once | INTRAMUSCULAR | Status: AC
  Administered 2013-11-12: 80 mg via INTRAMUSCULAR

## 2013-11-12 NOTE — Telephone Encounter (Signed)
Have pt stop by here today to check.

## 2013-11-12 NOTE — Patient Instructions (Signed)
Stop Biaxin.  Depo-Medrol 80 mg IM given. Have CT of sinuses done.

## 2013-11-14 ENCOUNTER — Other Ambulatory Visit: Payer: BC Managed Care – PPO

## 2013-11-15 ENCOUNTER — Ambulatory Visit
Admission: RE | Admit: 2013-11-15 | Discharge: 2013-11-15 | Disposition: A | Discharge status: Home / Self Care | Payer: BC Managed Care – PPO | Source: Ambulatory Visit | Attending: Internal Medicine | Admitting: Internal Medicine

## 2013-11-15 NOTE — Progress Notes (Signed)
Patient informed. 

## 2014-05-04 NOTE — Progress Notes (Signed)
   Subjective:    Patient ID: Erin Fernandez, female    DOB: 16-Feb-1961, 53 y.o.   MRN: [id number]  HPI  She was here March 5 for acute sinusitis and started on a trip to [place]. She had a Zithromax Z-Pak on hand and has started that prior to coming to the office. On March 5 was changed from Zithromax to Biaxin 500 mg twice daily for 10 days for lack of response to Zithromax. Now complaining of redness and swelling around her nose.    Review of Systems     Objective:   Physical Exam  Some mild erythema about her nose in between eyebrows. No other rash or redness noted. Still having some maxillary sinus tenderness. Pharynx is clear. Neck is supple with adenopathy. She has a bilateral serous otitis media      Assessment & Plan:  Acute sinusitis  Acute bilateral serous otitis media  ? Reaction to Biaxin  Plan: Depo-Medrol 80 mg IM. Order CT of sinuses.

## 2014-07-29 ENCOUNTER — Other Ambulatory Visit: Payer: Self-pay

## 2014-07-29 DIAGNOSIS — Z1231 Encounter for screening mammogram for malignant neoplasm of breast: Secondary | ICD-10-CM

## 2014-09-13 ENCOUNTER — Ambulatory Visit
Admission: RE | Admit: 2014-09-13 | Discharge: 2014-09-13 | Disposition: A | Discharge status: Home / Self Care | Payer: BLUE CROSS/BLUE SHIELD | Source: Ambulatory Visit

## 2014-09-13 DIAGNOSIS — Z1231 Encounter for screening mammogram for malignant neoplasm of breast: Secondary | ICD-10-CM

## 2015-09-10 ENCOUNTER — Other Ambulatory Visit: Payer: Self-pay

## 2015-09-10 DIAGNOSIS — Z1231 Encounter for screening mammogram for malignant neoplasm of breast: Secondary | ICD-10-CM

## 2015-10-03 ENCOUNTER — Other Ambulatory Visit: Payer: Self-pay | Admitting: Internal Medicine

## 2015-10-03 ENCOUNTER — Telehealth: Payer: Self-pay | Admitting: Internal Medicine

## 2015-10-03 ENCOUNTER — Ambulatory Visit
Admission: RE | Admit: 2015-10-03 | Discharge: 2015-10-03 | Disposition: A | Discharge status: Home / Self Care | Payer: BLUE CROSS/BLUE SHIELD | Source: Ambulatory Visit

## 2015-10-03 ENCOUNTER — Ambulatory Visit: Payer: Self-pay | Admitting: Internal Medicine

## 2015-10-03 DIAGNOSIS — Z1231 Encounter for screening mammogram for malignant neoplasm of breast: Secondary | ICD-10-CM

## 2015-10-03 DIAGNOSIS — N63 Unspecified lump in unspecified breast: Secondary | ICD-10-CM

## 2015-10-03 NOTE — Telephone Encounter (Signed)
Pt called saying she had lump in right breast and wanted diagnostic mammogram order. Records show she had normal mammogram January 8th. Order sent for diagnostic mammogram which is to be done January 31st.

## 2015-10-07 ENCOUNTER — Ambulatory Visit
Admission: RE | Admit: 2015-10-07 | Discharge: 2015-10-07 | Disposition: A | Discharge status: Home / Self Care | Payer: BLUE CROSS/BLUE SHIELD | Source: Ambulatory Visit | Attending: Internal Medicine | Admitting: Internal Medicine

## 2015-10-07 DIAGNOSIS — N63 Unspecified lump in unspecified breast: Secondary | ICD-10-CM

## 2015-10-28 ENCOUNTER — Other Ambulatory Visit: Payer: BLUE CROSS/BLUE SHIELD | Admitting: Internal Medicine

## 2015-10-28 DIAGNOSIS — Z1321 Encounter for screening for nutritional disorder: Secondary | ICD-10-CM

## 2015-10-28 DIAGNOSIS — Z1329 Encounter for screening for other suspected endocrine disorder: Secondary | ICD-10-CM

## 2015-10-28 DIAGNOSIS — Z0000 Encounter for general adult medical examination without abnormal findings: Secondary | ICD-10-CM

## 2015-10-28 DIAGNOSIS — Z130 Encounter for screening for diseases of the blood and blood-forming organs and certain disorders involving the immune mechanism: Secondary | ICD-10-CM

## 2015-10-28 DIAGNOSIS — Z13220 Encounter for screening for lipoid disorders: Secondary | ICD-10-CM

## 2015-10-28 LAB — CBC WITH DIFFERENTIAL/PLATELET
Basophils Absolute: 0.1 10*3/uL (ref 0.0–0.1)
Basophils Relative: 1 % (ref 0–1)
EOS PCT: 4 % (ref 0–5)
Eosinophils Absolute: 0.2 10*3/uL (ref 0.0–0.7)
HEMATOCRIT: 43.6 % (ref 36.0–46.0)
HEMOGLOBIN: 14.5 g/dL (ref 12.0–15.0)
LYMPHS ABS: 1.5 10*3/uL (ref 0.7–4.0)
LYMPHS PCT: 30 % (ref 12–46)
MCH: 31.1 pg (ref 26.0–34.0)
MCHC: 33.3 g/dL (ref 30.0–36.0)
MCV: 93.6 fL (ref 78.0–100.0)
MONO ABS: 0.5 10*3/uL (ref 0.1–1.0)
MONOS PCT: 9 % (ref 3–12)
MPV: 10.2 fL (ref 8.6–12.4)
NEUTROS ABS: 2.9 10*3/uL (ref 1.7–7.7)
Neutrophils Relative %: 56 % (ref 43–77)
Platelets: 218 10*3/uL (ref 150–400)
RBC: 4.66 MIL/uL (ref 3.87–5.11)
RDW: 13.9 % (ref 11.5–15.5)
WBC: 5.1 10*3/uL (ref 4.0–10.5)

## 2015-10-28 LAB — COMPLETE METABOLIC PANEL WITH GFR
ALBUMIN: 4.5 g/dL (ref 3.6–5.1)
ALK PHOS: 46 U/L (ref 33–130)
ALT: 13 U/L (ref 6–29)
AST: 17 U/L (ref 10–35)
BUN: 14 mg/dL (ref 7–25)
CALCIUM: 9.6 mg/dL (ref 8.6–10.4)
CO2: 28 mmol/L (ref 20–31)
Chloride: 104 mmol/L (ref 98–110)
Creat: 0.69 mg/dL (ref 0.50–1.05)
GFR, Est African American: >89 mL/min (ref >=60)
GFR, Est Non African American: >89 mL/min (ref >=60)
GLUCOSE: 82 mg/dL (ref 65–99)
POTASSIUM: 4.5 mmol/L (ref 3.5–5.3)
Sodium: 141 mmol/L (ref 135–146)
Total Bilirubin: 0.7 mg/dL (ref 0.2–1.2)
Total Protein: 7.2 g/dL (ref 6.1–8.1)

## 2015-10-28 LAB — LIPID PANEL
CHOL/HDL RATIO: 2.9 ratio (ref <=5.0)
Cholesterol: 191 mg/dL (ref 125–200)
HDL: 65 mg/dL (ref >=46)
LDL CALC: 112 mg/dL (ref <130)
Triglycerides: 69 mg/dL (ref <150)
VLDL: 14 mg/dL (ref <30)

## 2015-10-29 LAB — VITAMIN D 25 HYDROXY (VIT D DEFICIENCY, FRACTURES): VIT D 25 HYDROXY: 62 ng/mL (ref 30–100)

## 2015-10-29 LAB — TSH: TSH: 1.97 m[IU]/L

## 2015-11-07 ENCOUNTER — Other Ambulatory Visit: Payer: Self-pay

## 2015-11-07 ENCOUNTER — Other Ambulatory Visit (HOSPITAL_COMMUNITY)
Admission: RE | Admit: 2015-11-07 | Discharge: 2015-11-07 | Disposition: A | Discharge status: Home / Self Care | Payer: BLUE CROSS/BLUE SHIELD | Source: Ambulatory Visit | Attending: Internal Medicine | Admitting: Internal Medicine

## 2015-11-07 ENCOUNTER — Ambulatory Visit (INDEPENDENT_AMBULATORY_CARE_PROVIDER_SITE_OTHER): Payer: BLUE CROSS/BLUE SHIELD | Admitting: Internal Medicine

## 2015-11-07 ENCOUNTER — Encounter: Payer: Self-pay | Admitting: Internal Medicine

## 2015-11-07 VITALS — HR 74 | Temp 97.4°F | Resp 20 | Ht 66.0 in | Wt 130.5 lb

## 2015-11-07 DIAGNOSIS — Z124 Encounter for screening for malignant neoplasm of cervix: Secondary | ICD-10-CM | POA: Diagnosis not present

## 2015-11-07 DIAGNOSIS — K219 Gastro-esophageal reflux disease without esophagitis: Secondary | ICD-10-CM | POA: Diagnosis not present

## 2015-11-07 DIAGNOSIS — Z0000 Encounter for general adult medical examination without abnormal findings: Secondary | ICD-10-CM | POA: Diagnosis not present

## 2015-11-07 DIAGNOSIS — R1013 Epigastric pain: Secondary | ICD-10-CM | POA: Diagnosis not present

## 2015-11-07 DIAGNOSIS — Z01419 Encounter for gynecological examination (general) (routine) without abnormal findings: Secondary | ICD-10-CM | POA: Insufficient documentation

## 2015-11-07 LAB — POCT URINALYSIS DIPSTICK
Bilirubin, UA: NEGATIVE
Blood, UA: NEGATIVE
Glucose, UA: NEGATIVE
Ketones, UA: NEGATIVE
LEUKOCYTES UA: NEGATIVE
NITRITE UA: NEGATIVE
PH UA: 6.5
PROTEIN UA: NEGATIVE
Spec Grav, UA: <=1.005
UROBILINOGEN UA: 0.2

## 2015-11-07 NOTE — Patient Instructions (Signed)
To have ultrasound of the gallbladder in the next 2 weeks. May need see Dr. Kinnie ScalesMedoff if epigastric symptoms persist. Lab work is normal. It was a pleasure to see you today.

## 2015-11-07 NOTE — Progress Notes (Signed)
   Subjective:    Patient ID: Erin Fernandez, female    DOB: Aug 01, 1961, 55 y.o.   MRN: [id number]  HPI 55 year old female in today for health maintenance exam. History of GE reflux treated with PPI. Dr. Kinnie ScalesMedoff did colonoscopy September 2012. Study was normal and follow-up was recommended in 10 years. She had endoscopy at the same time for refractory reflux. Patient had complained previously to Protonix caused palpitations. Dr. Kinnie ScalesMedoff recommended AcipHex. More recently has been taking Nexium. Biopsies taken by Dr. Kinnie ScalesMedoff showed minimal focal active colitis. Duodenal biopsy showed mild chronic duodenitis. No evidence of sprue. Rectal biopsy showed benign colonic mucosa.  In May 2012 she had renal ultrasound and a hyperechoic lesion was noted in the right kidney. 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 She was evaluated at that time by Dr. Cassell Smilesanenbaum for microhematuria.  Past medical history: She is allergic to penicillin and sulfa-both of these cause rashes  History of endometrial polypectomy 2010.  Nonsmoker. Social alcohol consumption.  Social history: She is a self-employed Education officer, communitydentist. One daughter from previous marriage who lives in [place]. Patient is married to Erin Fernandez, DDS.  Family history: Father and brother with hypertension. Father with history of colitis and pacemaker. Mother in good health. Sister in good health.    Review of Systems complaining of epigastric pain. She is wondering about gallstones. Says she cannot get off of PPI without recurrent epigastric discomfort     Objective:   Physical Exam  Constitutional: She is oriented to person, place, and time. She appears well-developed and well-nourished. No distress.  HENT:  Head: Normocephalic and atraumatic.  Right Ear: External ear normal.  Left Ear: External ear normal.  Mouth/Throat: Oropharynx is clear and moist.  Eyes: Conjunctivae and EOM are normal. Pupils  are equal, round, and reactive to light. Right eye exhibits no discharge. Left eye exhibits no discharge. No scleral icterus.  Neck: Neck supple. No JVD present. No thyromegaly present.  Cardiovascular: Normal rate, regular rhythm, normal heart sounds and intact distal pulses.   No murmur heard. Pulmonary/Chest: Effort normal and breath sounds normal. No respiratory distress. She has no wheezes. She has no rales.  Breasts normal female without masses  Abdominal: Soft. Bowel sounds are normal. She exhibits no distension and no mass. There is no tenderness. There is no rebound and no guarding.  Genitourinary:  Pap taken. Bimanual normal  Musculoskeletal: Normal range of motion. She exhibits no edema.  Lymphadenopathy:    She has no cervical adenopathy.  Neurological: She is alert and oriented to person, place, and time. She has normal reflexes. No cranial nerve deficit.  Skin: Skin is warm and dry. No rash noted.  Psychiatric: She has a normal mood and affect. Her behavior is normal. Judgment and thought content normal.  Vitals reviewed.         Assessment & Plan:  Normal health maintenance exam  Epigastric pain  History of palpable breast lump with negative evaluation by Dr. Manson PasseyBrown  Plan: Patient will have an ultrasound in the next 2 weeks to see if she does indeed have gallstones. I think her main issue is gastritis and GE reflux. She may need to see Dr. Kinnie ScalesMedoff once again. Fasting labs are entirely within normal limits.

## 2015-11-11 LAB — CYTOLOGY - PAP

## 2015-11-12 ENCOUNTER — Telehealth: Payer: Self-pay

## 2015-11-12 NOTE — Telephone Encounter (Signed)
Patient notified

## 2015-11-19 ENCOUNTER — Ambulatory Visit (INDEPENDENT_AMBULATORY_CARE_PROVIDER_SITE_OTHER): Payer: BLUE CROSS/BLUE SHIELD | Admitting: Urgent Care

## 2015-11-19 VITALS — BP 120/68 | HR 66 | Temp 100.4°F | Resp 16 | Ht 67.0 in | Wt 131.0 lb

## 2015-11-19 DIAGNOSIS — R0981 Nasal congestion: Secondary | ICD-10-CM

## 2015-11-19 DIAGNOSIS — J101 Influenza due to other identified influenza virus with other respiratory manifestations: Secondary | ICD-10-CM | POA: Diagnosis not present

## 2015-11-19 DIAGNOSIS — R6883 Chills (without fever): Secondary | ICD-10-CM

## 2015-11-19 DIAGNOSIS — R52 Pain, unspecified: Secondary | ICD-10-CM | POA: Diagnosis not present

## 2015-11-19 DIAGNOSIS — R509 Fever, unspecified: Secondary | ICD-10-CM

## 2015-11-19 LAB — POCT INFLUENZA A/B
INFLUENZA B, POC: POSITIVE — AB
Influenza A, POC: NEGATIVE

## 2015-11-19 MED ORDER — OSELTAMIVIR PHOSPHATE 75 MG PO CAPS: 75.0000 mg | ORAL_CAPSULE | Freq: Two times a day (BID) | ORAL | Status: DC

## 2015-11-19 MED ORDER — HYDROCODONE-HOMATROPINE 5-1.5 MG/5ML PO SYRP: 5.0000 mL | ORAL_SOLUTION | Freq: Every evening | ORAL | Status: DC | PRN

## 2015-11-19 NOTE — Progress Notes (Signed)
    MRN: [id number] DOB: 10-Sep-1960  Subjective:   Erin Fernandez is a 55 y.o. female presenting for chief complaint of Sinusitis; Cough; Fever; Chills; and Generalized Body Aches  Reports 1 day history of body aches, chills, fever, sinus congestion. Has tried Advil for her fever. Of note, her husband has been sick for the past week. Denies cough, chest pain, shob, n/v, abdominal pain, sinus pain, ear pain, sore throat. 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 She did not get her flu shot this year. Denies smoking cigarettes or drinking alcohol.   Erin Fernandez has a current medication list which includes the following prescription(s): esomeprazole and UNABLE TO FIND. Also is allergic to penicillins and sulfa antibiotics.  Erin Fernandez  has a past medical history of GERD (gastroesophageal reflux disease). Also  has past surgical history that includes Uterine fibroid surgery.  Her family history includes Cholecystitis in her father; Hypertension in her brother and father.   Objective:   Vitals: BP 120/68 mmHg  Pulse 66  Temp(Src) 100.4 F (38 C) (Oral)  Resp 16  Ht 5\' 7"  (1.702 m)  Wt 131 lb (59.421 kg)  BMI 20.51 kg/m2  SpO2 98%  Physical Exam  Constitutional: She is oriented to person, place, and time. She appears well-developed and well-nourished.  HENT:  TM's intact bilaterally, no effusions or erythema. Nasal turbinates pink and moist, nasal passages patent. No sinus tenderness. Oropharynx clear, mucous membranes moist, dentition in good repair.  Eyes: Right eye exhibits no discharge. Left eye exhibits no discharge. No scleral icterus.  Neck: Normal range of motion. Neck supple.  Cardiovascular: Normal rate, regular rhythm and intact distal pulses.  Exam reveals no gallop and no friction rub.   No murmur heard. Pulmonary/Chest: No respiratory distress. She has no wheezes. She has no rales.  Lymphadenopathy:    She has no cervical adenopathy.  Neurological: She  is alert and oriented to person, place, and time.  Skin: Skin is warm and dry.   Results for orders placed or performed in visit on 11/19/15 (from the past 24 hour(s))  POCT Influenza A/B     Status: Abnormal   Collection Time: 11/19/15  5:39 PM  Result Value Ref Range   Influenza A, POC Negative Negative   Influenza B, POC Positive (A) Negative    Assessment and Plan :   1. Influenza B 2. Body aches 3. Fever, unspecified 4. Nasal congestion 5. Chills - Counseled on diagnosis. Start tamiflu, supportive care. Recheck in 1 week if no improvement.  Wallis BambergMario Melanye Hiraldo, PA-C [facility] [phone] 11/19/2015 5:25 PM

## 2015-11-19 NOTE — Patient Instructions (Signed)
     IF you received an x-ray today, you will receive an invoice from [facility]. Please contact [facility] at [phone] with questions or concerns regarding your invoice.   IF you received labwork today, you will receive an invoice from [facility]. Please contact [facility] at [phone] with questions or concerns regarding your invoice.   Our billing staff will not be able to assist you with questions regarding bills from these companies.  You will be contacted with the lab results as soon as they are available. The fastest way to get your results is to activate your My Chart account. Instructions are located on the last page of this paperwork. If you have not heard from us regarding the results in 2 weeks, please contact this office.      

## 2015-11-28 ENCOUNTER — Ambulatory Visit
Admission: RE | Admit: 2015-11-28 | Discharge: 2015-11-28 | Disposition: A | Discharge status: Home / Self Care | Payer: BLUE CROSS/BLUE SHIELD | Source: Ambulatory Visit | Attending: Internal Medicine | Admitting: Internal Medicine

## 2015-11-28 DIAGNOSIS — R1013 Epigastric pain: Secondary | ICD-10-CM

## 2016-09-02 IMAGING — US US ABDOMEN LIMITED
1 series · 14 of 25 positions shown · non-contrast
Comparison: Abdominal ultrasound August 06, 2011

CLINICAL DATA: Five years of intermittent epigastric pain,
currently on Nexium

EXAM:
US ABDOMEN LIMITED - RIGHT UPPER QUADRANT

[Series 1: us abdomen limited · 0.18mm/px · 14 of 37 slices shown]
[im 1/37]
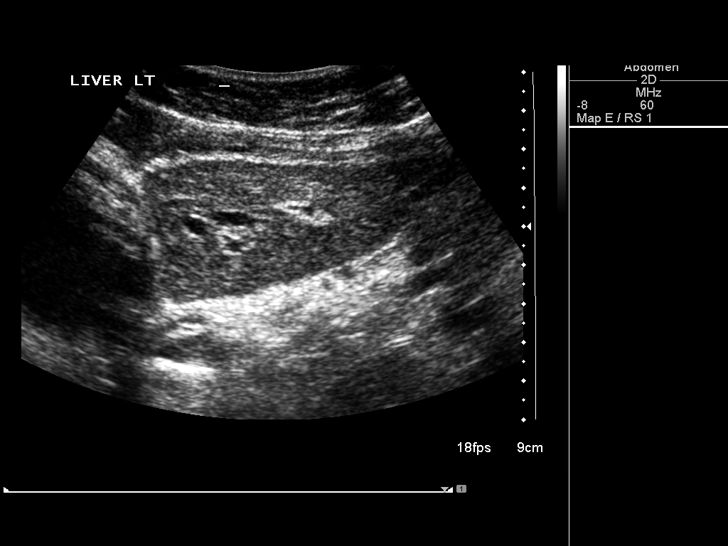
[im 4/37]
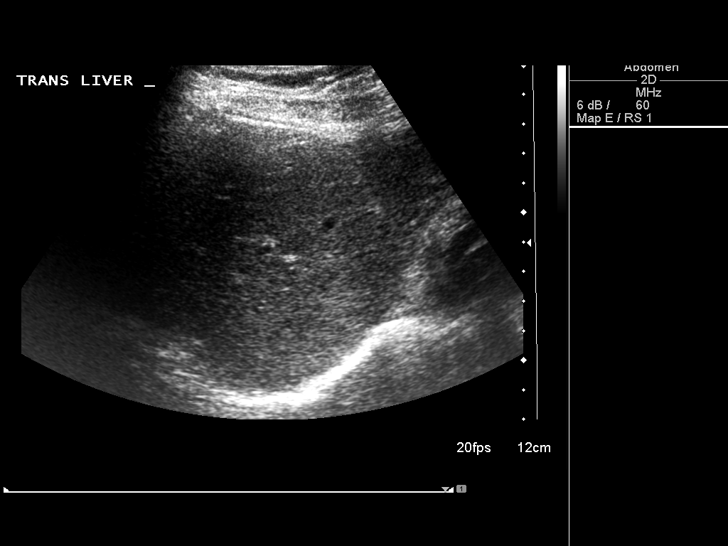
[im 7/37]
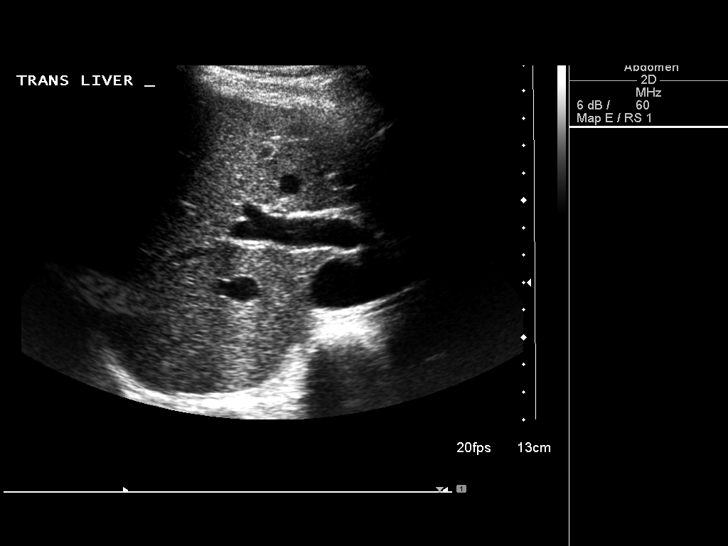
[im 10/37]
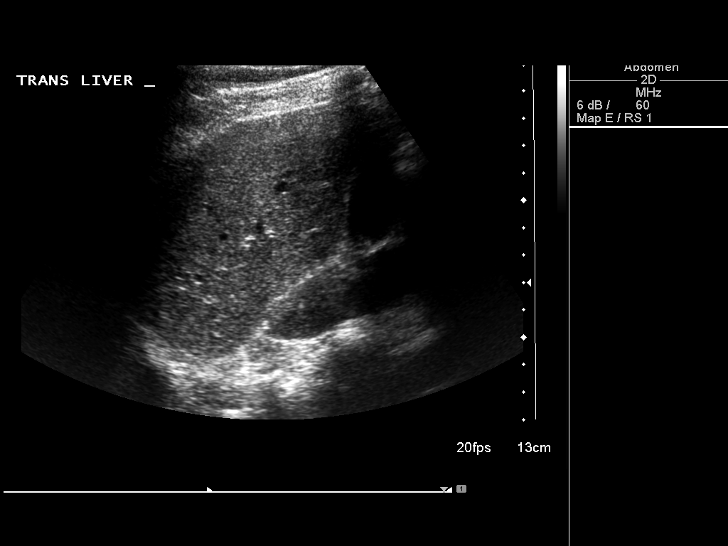
[im 13/37]
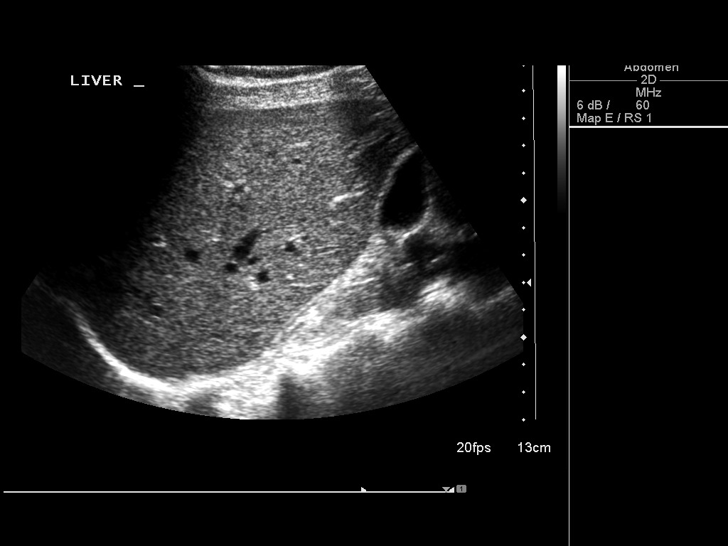
[im 14/37]
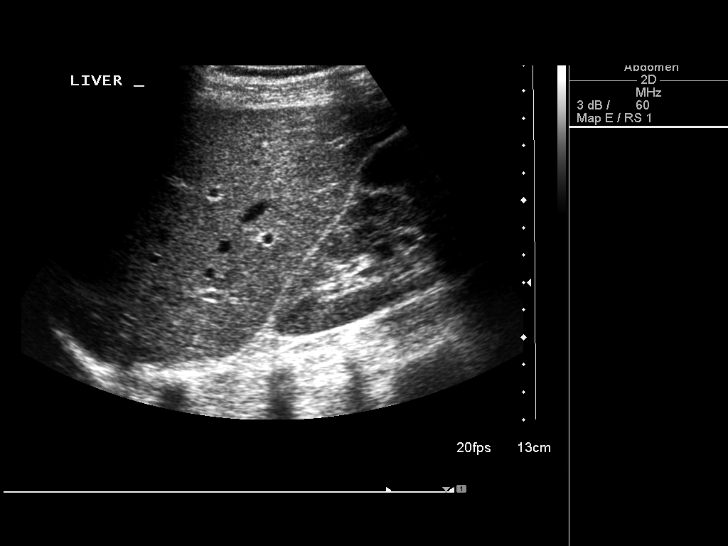
[im 17/37]
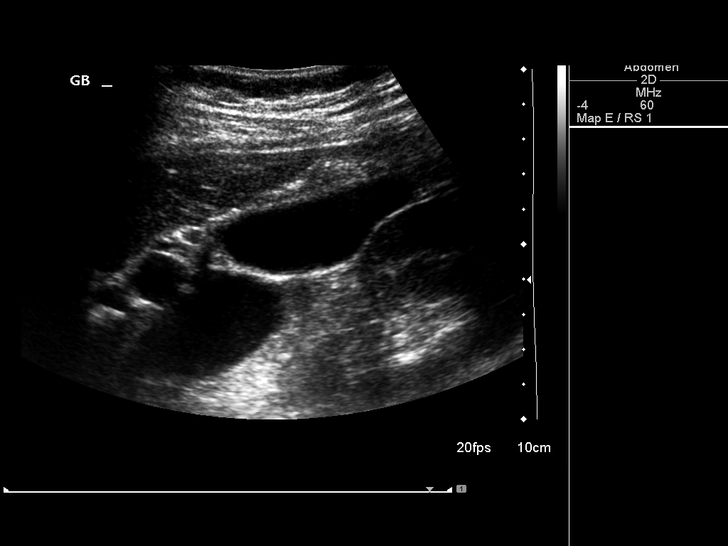
[im 20/37]
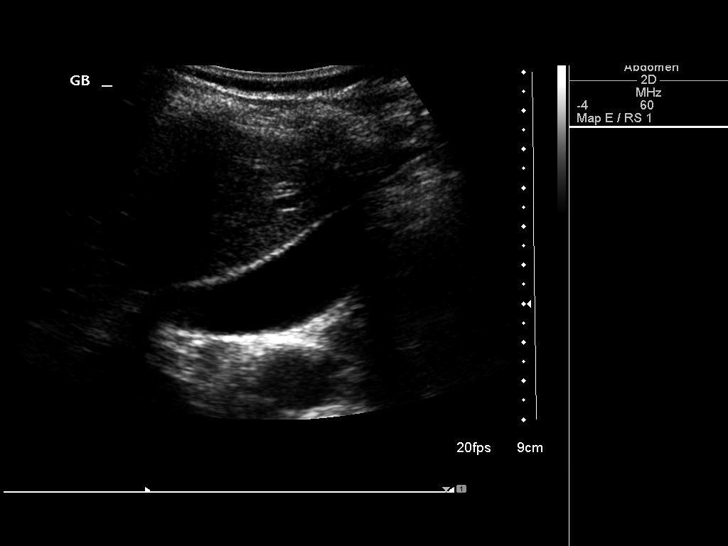
[im 23/37]
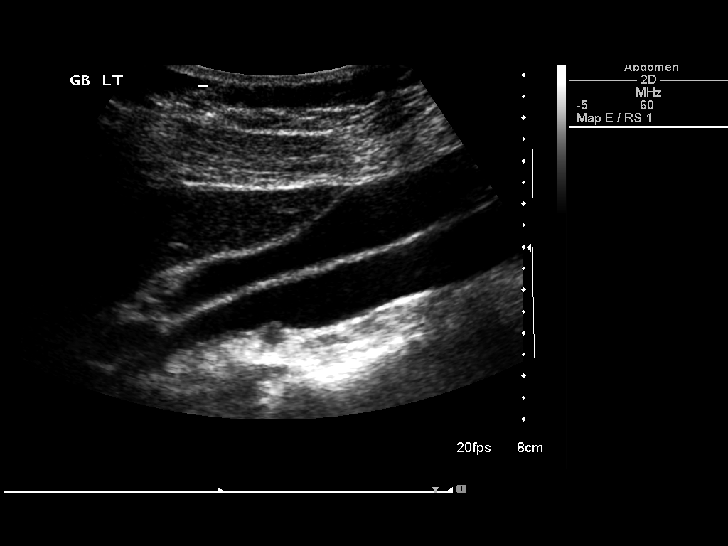
[im 25/37]
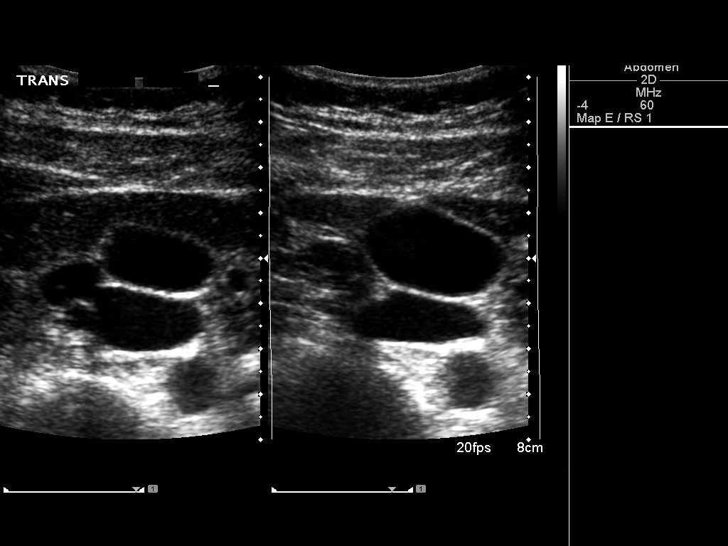
[im 28/37]
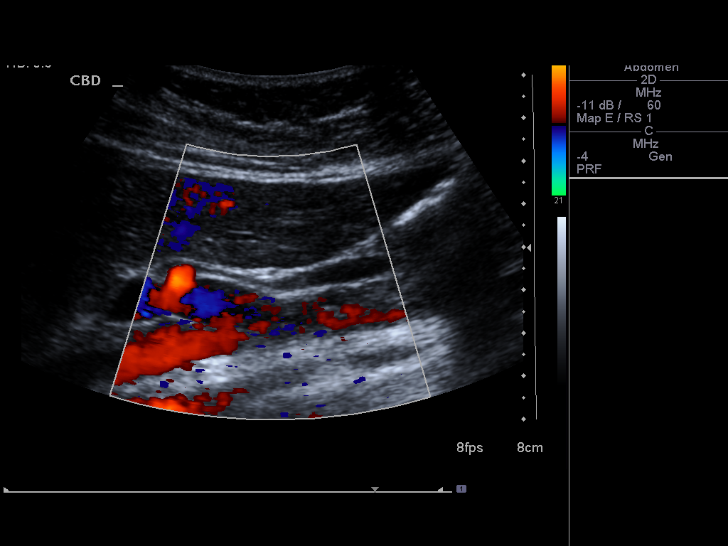
[im 31/37]
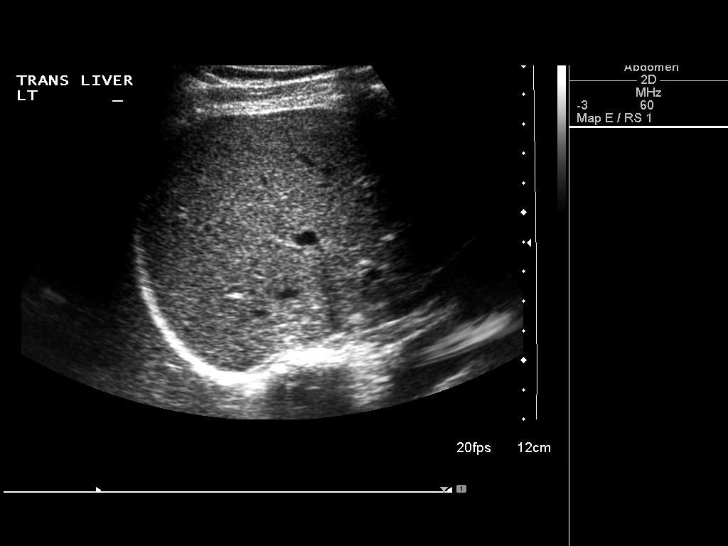
[im 34/37]
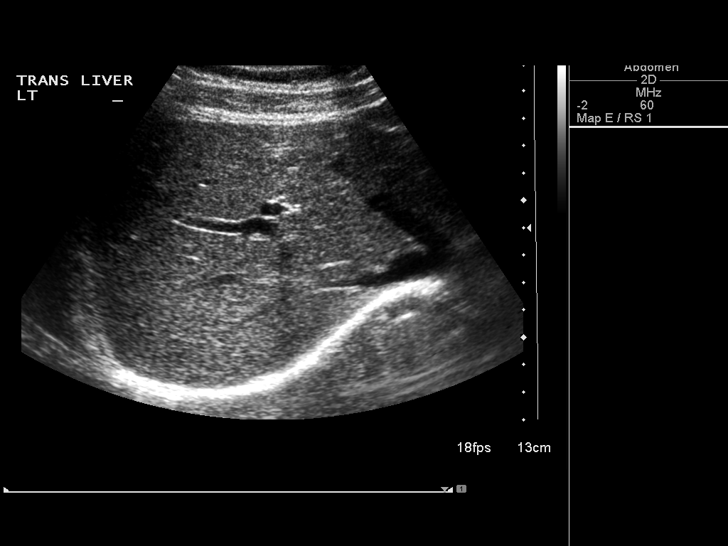
[im 37/37]
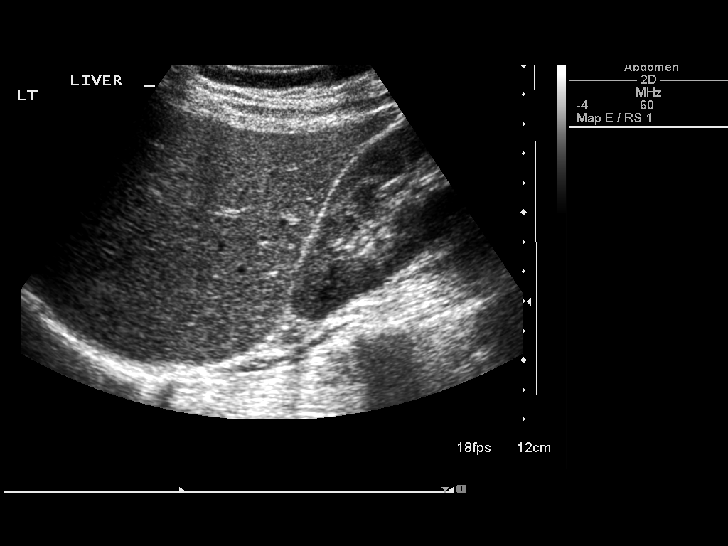

[14 of 25 positions shown; findings below may reference images not displayed]

FINDINGS: Gallbladder:

No gallstones or wall thickening visualized. No sonographic Murphy
sign noted by sonographer.

Common bile duct:

Diameter: 4 mm

Liver:

The liver exhibits normal echotexture. There is no focal mass or
ductal dilation. The surface contour of the liver is normal.
IMPRESSION: Normal limited right upper quadrant ultrasound. If gallbladder
dysfunction is suspected clinically, a nuclear medicine
hepatobiliary scan may be useful.

## 2016-11-05 ENCOUNTER — Other Ambulatory Visit: Payer: Self-pay | Admitting: Internal Medicine

## 2016-11-05 DIAGNOSIS — Z1231 Encounter for screening mammogram for malignant neoplasm of breast: Secondary | ICD-10-CM

## 2016-11-23 ENCOUNTER — Ambulatory Visit
Admission: RE | Admit: 2016-11-23 | Discharge: 2016-11-23 | Disposition: A | Discharge status: Home / Self Care | Payer: BLUE CROSS/BLUE SHIELD | Source: Ambulatory Visit | Attending: Internal Medicine | Admitting: Internal Medicine

## 2016-11-23 DIAGNOSIS — Z1231 Encounter for screening mammogram for malignant neoplasm of breast: Secondary | ICD-10-CM

## 2017-10-31 ENCOUNTER — Other Ambulatory Visit: Payer: Self-pay

## 2017-10-31 DIAGNOSIS — Z0000 Encounter for general adult medical examination without abnormal findings: Secondary | ICD-10-CM

## 2017-10-31 DIAGNOSIS — Z1321 Encounter for screening for nutritional disorder: Secondary | ICD-10-CM

## 2017-10-31 DIAGNOSIS — Z13220 Encounter for screening for lipoid disorders: Secondary | ICD-10-CM

## 2017-10-31 DIAGNOSIS — Z1329 Encounter for screening for other suspected endocrine disorder: Secondary | ICD-10-CM

## 2017-11-18 ENCOUNTER — Other Ambulatory Visit: Payer: PRIVATE HEALTH INSURANCE | Admitting: Internal Medicine

## 2017-11-18 DIAGNOSIS — Z1321 Encounter for screening for nutritional disorder: Secondary | ICD-10-CM

## 2017-11-18 DIAGNOSIS — Z0000 Encounter for general adult medical examination without abnormal findings: Secondary | ICD-10-CM

## 2017-11-18 DIAGNOSIS — Z1329 Encounter for screening for other suspected endocrine disorder: Secondary | ICD-10-CM

## 2017-11-18 DIAGNOSIS — Z13220 Encounter for screening for lipoid disorders: Secondary | ICD-10-CM

## 2017-11-19 LAB — CBC WITH DIFFERENTIAL/PLATELET
BASOS PCT: 0.7 %
Basophils Absolute: 39 cells/uL (ref 0–200)
Eosinophils Absolute: 168 cells/uL (ref 15–500)
Eosinophils Relative: 3 %
HCT: 43.6 % (ref 35.0–45.0)
Hemoglobin: 14.9 g/dL (ref 11.7–15.5)
Lymphs Abs: 1266 cells/uL (ref 850–3900)
MCH: 31.5 pg (ref 27.0–33.0)
MCHC: 34.2 g/dL (ref 32.0–36.0)
MCV: 92.2 fL (ref 80.0–100.0)
MONOS PCT: 7.5 %
MPV: 10.7 fL (ref 7.5–12.5)
Neutro Abs: 3707 cells/uL (ref 1500–7800)
Neutrophils Relative %: 66.2 %
Platelets: 242 10*3/uL (ref 140–400)
RBC: 4.73 10*6/uL (ref 3.80–5.10)
RDW: 12.5 % (ref 11.0–15.0)
Total Lymphocyte: 22.6 %
WBC: 5.6 10*3/uL (ref 3.8–10.8)
WBCMIX: 420 {cells}/uL (ref 200–950)

## 2017-11-19 LAB — LIPID PANEL
Cholesterol: 197 mg/dL (ref <200)
HDL: 63 mg/dL (ref >50)
LDL CHOLESTEROL (CALC): 118 mg/dL — AB
NON-HDL CHOLESTEROL (CALC): 134 mg/dL — AB (ref <130)
TRIGLYCERIDES: 64 mg/dL (ref <150)
Total CHOL/HDL Ratio: 3.1 (calc) (ref <5.0)

## 2017-11-19 LAB — COMPLETE METABOLIC PANEL WITH GFR
AG RATIO: 1.9 (calc) (ref 1.0–2.5)
ALBUMIN MSPROF: 4.5 g/dL (ref 3.6–5.1)
ALKALINE PHOSPHATASE (APISO): 59 U/L (ref 33–130)
ALT: 16 U/L (ref 6–29)
AST: 21 U/L (ref 10–35)
BILIRUBIN TOTAL: 0.6 mg/dL (ref 0.2–1.2)
BUN: 15 mg/dL (ref 7–25)
CHLORIDE: 106 mmol/L (ref 98–110)
CO2: 27 mmol/L (ref 20–32)
Calcium: 9.9 mg/dL (ref 8.6–10.4)
Creat: 0.75 mg/dL (ref 0.50–1.05)
GFR, EST AFRICAN AMERICAN: 103 mL/min/{1.73_m2} (ref >=60)
GFR, Est Non African American: 89 mL/min/{1.73_m2} (ref >=60)
GLUCOSE: 82 mg/dL (ref 65–99)
Globulin: 2.4 g/dL (calc) (ref 1.9–3.7)
POTASSIUM: 4.4 mmol/L (ref 3.5–5.3)
Sodium: 142 mmol/L (ref 135–146)
Total Protein: 6.9 g/dL (ref 6.1–8.1)

## 2017-11-19 LAB — TSH: TSH: 1.41 mIU/L (ref 0.40–4.50)

## 2017-11-19 LAB — VITAMIN D 25 HYDROXY (VIT D DEFICIENCY, FRACTURES): VIT D 25 HYDROXY: 72 ng/mL (ref 30–100)

## 2017-11-22 ENCOUNTER — Encounter: Payer: Self-pay | Admitting: Internal Medicine

## 2017-11-22 ENCOUNTER — Other Ambulatory Visit (HOSPITAL_COMMUNITY)
Admission: RE | Admit: 2017-11-22 | Discharge: 2017-11-22 | Disposition: A | Discharge status: Home / Self Care | Payer: PRIVATE HEALTH INSURANCE | Source: Ambulatory Visit | Attending: Internal Medicine | Admitting: Internal Medicine

## 2017-11-22 ENCOUNTER — Ambulatory Visit (INDEPENDENT_AMBULATORY_CARE_PROVIDER_SITE_OTHER): Payer: PRIVATE HEALTH INSURANCE | Admitting: Internal Medicine

## 2017-11-22 VITALS — BP 120/80 | HR 83 | Ht 65.75 in | Wt 128.0 lb

## 2017-11-22 DIAGNOSIS — Z124 Encounter for screening for malignant neoplasm of cervix: Secondary | ICD-10-CM | POA: Insufficient documentation

## 2017-11-22 DIAGNOSIS — N951 Menopausal and female climacteric states: Secondary | ICD-10-CM | POA: Diagnosis not present

## 2017-11-22 DIAGNOSIS — G4700 Insomnia, unspecified: Secondary | ICD-10-CM | POA: Diagnosis not present

## 2017-11-22 DIAGNOSIS — Z0000 Encounter for general adult medical examination without abnormal findings: Secondary | ICD-10-CM

## 2017-11-22 DIAGNOSIS — E2839 Other primary ovarian failure: Secondary | ICD-10-CM | POA: Diagnosis not present

## 2017-11-22 LAB — POCT URINALYSIS DIPSTICK
APPEARANCE: NORMAL
BILIRUBIN UA: NEGATIVE
GLUCOSE UA: NEGATIVE
Ketones, UA: NEGATIVE
LEUKOCYTES UA: NEGATIVE
Nitrite, UA: NEGATIVE
Odor: NORMAL
Protein, UA: NEGATIVE
Spec Grav, UA: 1.01 (ref 1.010–1.025)
Urobilinogen, UA: 0.2 E.U./dL
pH, UA: 6.5 (ref 5.0–8.0)

## 2017-11-22 MED ORDER — ALPRAZOLAM 0.5 MG PO TABS: 0.5000 mg | ORAL_TABLET | Freq: Two times a day (BID) | ORAL | 0 refills | Status: DC | PRN

## 2017-11-22 NOTE — Progress Notes (Signed)
Subjective:    Patient ID: Erin Fernandez, female    DOB: February 28, 1961, 57 y.o.   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 Having issues with hot flashes and insomnia.  Have given her prescription for Xanax to help with that which she will be taking sparingly. Only takes Xanax when she has an upcoming hard day at work or trip.  Recently went to [place].  Someone had given her some Xanax to take and it helped her.  Dr. Kinnie ScalesMedoff performed colonoscopy in September 2012 as patient was complaining of intermittent diarrhea.  Colonoscopy was normal.  Random biopsies were performed to rule out microscopic inflammation.  Old records indicate that she had a previous colonoscopy in 2007 which was normal.  In 2012 she was having epigastric burning despite Protonix.  She had negative H. pylori serologies through this office in 2012 as well.  Multiple medications did not improved complaints of epigastric burning including Protonix Zantac and Prilosec.  She had panendoscopy in September 2012 to evaluate diarrhea and dyspepsia.  Helical factor biopsy was negative.  Duodenal biopsy showed peptic inflammation with no evidence of celiac or Crohn's disease.  Cecal biopsy showed focal active colitis and rectal biopsy was normal.  She was given a trial of mesalamine-apriso.  She had repeat colonoscopy and endoscopy in July 2017 for abdominal pain and diarrhea.  She subsequently went to see a nutritionist and more recently has been avoiding certain foods such as milk and certain gluten-containing foods and seems to be doing better with regard to GI complaints.  In May 2012 she had abdominal ultrasound ordered by Dr. Kinnie ScalesMedoff for evaluation of abdominal pain and a hyperechoic lesion was noted in the right kidney.  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  She was evaluated at that time by Dr. Patsi Searsannenbaum for  microhematuria.  Past medical history: She is allergic to penicillin and sulfa-both of these cause rashes.  Non-smoker.  Social alcohol consumption.  History of endometrial polypectomy in 2010.  Social history: She is a self-employed Education officer, communitydentist.  One daughter from previous married she lives in [place].  Patient is married to Bubba Campharles Langdon, DDS.  Family history: Father and brother with hypertension.  Father with history of colitis and pacemaker.  Father has developed Alzheimer's disease.  Mother in good health.  Sister in good health.  Parents are now living at [place].  Patient watches her diet and exercises regularly.     Review of Systems patient says she needs order for mammogram.  Has not had bone density study since 2011 which was ordered by her GYN Dr. Edward JollySilva and was normal at the time.     Objective:   Physical Exam  Constitutional: She is oriented to person, place, and time. She appears well-developed and well-nourished. No distress.  HENT:  Head: Normocephalic and atraumatic.  Right Ear: External ear normal.  Left Ear: External ear normal.  Mouth/Throat: Oropharynx is clear and moist.  Eyes: Conjunctivae and EOM are normal. Pupils are equal, round, and reactive to light. Right eye exhibits no discharge. Left eye exhibits no discharge.  Neck: Neck supple. No JVD present. No thyromegaly present.  Cardiovascular: Normal rate, regular rhythm and normal heart sounds.  No murmur heard. Pulmonary/Chest: Effort normal and breath sounds normal. No respiratory distress. She has no wheezes. She has no rales.  Abdominal: Soft. Bowel sounds are normal. She exhibits  no distension and no mass. There is no tenderness. There is no rebound and no guarding.  Genitourinary: Vagina normal and uterus normal.  Genitourinary Comments: Bimanual exam without masses.  Rectovaginal confirms.  Musculoskeletal: She exhibits no edema.  Lymphadenopathy:    She has no cervical adenopathy.    Neurological: She is alert and oriented to person, place, and time. No cranial nerve deficit. Coordination normal.  Skin: Skin is warm and dry. No rash noted. She is not diaphoretic.  Psychiatric: She has a normal mood and affect. Her behavior is normal. Judgment and thought content normal.  Vitals reviewed.         Assessment & Plan:  Normal health maintenance exam  Mild elevation of LDL at 118.  Previous LDL in 2017 was 112.  In 2014 LDL was 109.  She has a good HDL cholesterol.  Triglycerides are normal.  We will merely recommend that she watch her diet a bit since she is been traveling.  History of dyspepsia treated with PPI  Menopausal with hot flashes and insomnia which will be treated with Xanax sparingly.  Plan: Return in 1 year or as needed.

## 2017-11-22 NOTE — Patient Instructions (Signed)
It was a pleasure to see you today.  Take Xanax sparingly for insomnia and vasomotor symptoms.  Return in 1 year or as needed.

## 2017-11-24 LAB — CYTOLOGY - PAP: Diagnosis: NEGATIVE

## 2018-01-06 ENCOUNTER — Ambulatory Visit
Admission: RE | Admit: 2018-01-06 | Discharge: 2018-01-06 | Disposition: A | Discharge status: Home / Self Care | Payer: PRIVATE HEALTH INSURANCE | Source: Ambulatory Visit | Attending: Internal Medicine | Admitting: Internal Medicine

## 2018-01-06 DIAGNOSIS — Z0000 Encounter for general adult medical examination without abnormal findings: Secondary | ICD-10-CM

## 2018-01-06 DIAGNOSIS — E2839 Other primary ovarian failure: Secondary | ICD-10-CM

## 2019-02-23 ENCOUNTER — Telehealth: Payer: Self-pay | Admitting: *Deleted

## 2019-02-23 ENCOUNTER — Other Ambulatory Visit: Payer: Self-pay

## 2019-02-23 ENCOUNTER — Telehealth: Payer: Self-pay | Admitting: Internal Medicine

## 2019-02-23 DIAGNOSIS — Z20822 Contact with and (suspected) exposure to covid-19: Secondary | ICD-10-CM

## 2019-02-23 NOTE — Telephone Encounter (Signed)
Patient scheduled for covid testing today @ [facility] @ 12:45. Instructions given and order placed

## 2019-02-23 NOTE — Telephone Encounter (Signed)
Erin Fernandez [phone]  Katharine Look called to say that she woke up with fever of 99.9 this morning, she has no other symptoms, has not been around anyone with COVID that she knows of. She did go and have her 2nd shingle shot yesterday. Should she go be tested for COVID-19

## 2019-02-23 NOTE — Telephone Encounter (Signed)
Please set patient up for testing  As she is having sxs of covid-19. Patient is a Pharmacist, community.

## 2019-02-23 NOTE — Telephone Encounter (Signed)
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

## 2019-03-01 LAB — NOVEL CORONAVIRUS, NAA: SARS-CoV-2, NAA: NOT DETECTED

## 2019-04-13 ENCOUNTER — Other Ambulatory Visit: Payer: Self-pay | Admitting: Internal Medicine

## 2019-04-13 ENCOUNTER — Other Ambulatory Visit: Payer: Self-pay | Admitting: Cardiology

## 2019-04-13 DIAGNOSIS — Z1231 Encounter for screening mammogram for malignant neoplasm of breast: Secondary | ICD-10-CM

## 2019-05-04 ENCOUNTER — Other Ambulatory Visit: Payer: Self-pay

## 2019-05-04 ENCOUNTER — Ambulatory Visit
Admission: RE | Admit: 2019-05-04 | Discharge: 2019-05-04 | Disposition: A | Discharge status: Home / Self Care | Payer: PRIVATE HEALTH INSURANCE | Source: Ambulatory Visit

## 2019-05-04 DIAGNOSIS — Z1231 Encounter for screening mammogram for malignant neoplasm of breast: Secondary | ICD-10-CM

## 2020-02-15 ENCOUNTER — Other Ambulatory Visit: Payer: PRIVATE HEALTH INSURANCE | Admitting: Internal Medicine

## 2020-02-15 DIAGNOSIS — Z0000 Encounter for general adult medical examination without abnormal findings: Secondary | ICD-10-CM

## 2020-02-15 DIAGNOSIS — G4700 Insomnia, unspecified: Secondary | ICD-10-CM

## 2020-02-15 DIAGNOSIS — Z1329 Encounter for screening for other suspected endocrine disorder: Secondary | ICD-10-CM

## 2020-02-15 DIAGNOSIS — Z1321 Encounter for screening for nutritional disorder: Secondary | ICD-10-CM

## 2020-02-15 DIAGNOSIS — E2839 Other primary ovarian failure: Secondary | ICD-10-CM

## 2020-02-15 DIAGNOSIS — K219 Gastro-esophageal reflux disease without esophagitis: Secondary | ICD-10-CM

## 2020-02-15 DIAGNOSIS — E7800 Pure hypercholesterolemia, unspecified: Secondary | ICD-10-CM

## 2020-02-16 LAB — CBC WITH DIFFERENTIAL/PLATELET
Absolute Monocytes: 464 cells/uL (ref 200–950)
Basophils Absolute: 38 cells/uL (ref 0–200)
Basophils Relative: 0.7 %
Eosinophils Absolute: 130 cells/uL (ref 15–500)
Eosinophils Relative: 2.4 %
HCT: 43.5 % (ref 35.0–45.0)
Hemoglobin: 14.3 g/dL (ref 11.7–15.5)
Lymphs Abs: 1426 cells/uL (ref 850–3900)
MCH: 31 pg (ref 27.0–33.0)
MCHC: 32.9 g/dL (ref 32.0–36.0)
MCV: 94.4 fL (ref 80.0–100.0)
MPV: 10.5 fL (ref 7.5–12.5)
Monocytes Relative: 8.6 %
Neutro Abs: 3343 cells/uL (ref 1500–7800)
Neutrophils Relative %: 61.9 %
Platelets: 228 10*3/uL (ref 140–400)
RBC: 4.61 10*6/uL (ref 3.80–5.10)
RDW: 13.1 % (ref 11.0–15.0)
Total Lymphocyte: 26.4 %
WBC: 5.4 10*3/uL (ref 3.8–10.8)

## 2020-02-16 LAB — COMPLETE METABOLIC PANEL WITH GFR
AG Ratio: 1.8 (calc) (ref 1.0–2.5)
ALT: 10 U/L (ref 6–29)
AST: 16 U/L (ref 10–35)
Albumin: 4.3 g/dL (ref 3.6–5.1)
Alkaline phosphatase (APISO): 57 U/L (ref 37–153)
BUN: 15 mg/dL (ref 7–25)
CO2: 26 mmol/L (ref 20–32)
Calcium: 9.4 mg/dL (ref 8.6–10.4)
Chloride: 108 mmol/L (ref 98–110)
Creat: 0.85 mg/dL (ref 0.50–1.05)
GFR, Est African American: 88 mL/min/{1.73_m2} (ref >=60)
GFR, Est Non African American: 76 mL/min/{1.73_m2} (ref >=60)
Globulin: 2.4 g/dL (calc) (ref 1.9–3.7)
Glucose, Bld: 86 mg/dL (ref 65–99)
Potassium: 4.5 mmol/L (ref 3.5–5.3)
Sodium: 141 mmol/L (ref 135–146)
Total Bilirubin: 0.6 mg/dL (ref 0.2–1.2)
Total Protein: 6.7 g/dL (ref 6.1–8.1)

## 2020-02-16 LAB — LIPID PANEL
Cholesterol: 186 mg/dL (ref <200)
HDL: 54 mg/dL (ref >=50)
LDL Cholesterol (Calc): 114 mg/dL (calc) — ABNORMAL HIGH
Non-HDL Cholesterol (Calc): 132 mg/dL (calc) — ABNORMAL HIGH (ref <130)
Total CHOL/HDL Ratio: 3.4 (calc) (ref <5.0)
Triglycerides: 82 mg/dL (ref <150)

## 2020-02-16 LAB — VITAMIN D 25 HYDROXY (VIT D DEFICIENCY, FRACTURES): Vit D, 25-Hydroxy: 59 ng/mL (ref 30–100)

## 2020-02-16 LAB — TSH: TSH: 2.13 mIU/L (ref 0.40–4.50)

## 2020-02-28 ENCOUNTER — Encounter: Payer: Self-pay | Admitting: Internal Medicine

## 2020-02-28 ENCOUNTER — Other Ambulatory Visit: Payer: Self-pay

## 2020-02-28 ENCOUNTER — Ambulatory Visit (INDEPENDENT_AMBULATORY_CARE_PROVIDER_SITE_OTHER): Payer: PRIVATE HEALTH INSURANCE | Admitting: Internal Medicine

## 2020-02-28 ENCOUNTER — Ambulatory Visit: Payer: PRIVATE HEALTH INSURANCE | Admitting: Internal Medicine

## 2020-02-28 VITALS — BP 100/80 | HR 63 | Ht 66.0 in | Wt 134.0 lb

## 2020-02-28 DIAGNOSIS — E2839 Other primary ovarian failure: Secondary | ICD-10-CM | POA: Diagnosis not present

## 2020-02-28 DIAGNOSIS — E7800 Pure hypercholesterolemia, unspecified: Secondary | ICD-10-CM

## 2020-02-28 DIAGNOSIS — Z0000 Encounter for general adult medical examination without abnormal findings: Secondary | ICD-10-CM

## 2020-02-28 LAB — POCT URINALYSIS DIPSTICK
Appearance: NEGATIVE
Bilirubin, UA: NEGATIVE
Blood, UA: NEGATIVE
Glucose, UA: NEGATIVE
Ketones, UA: NEGATIVE
Leukocytes, UA: NEGATIVE
Nitrite, UA: NEGATIVE
Odor: NEGATIVE
Protein, UA: NEGATIVE
Spec Grav, UA: 1.01 (ref 1.010–1.025)
Urobilinogen, UA: 0.2 E.U./dL
pH, UA: 6.5 (ref 5.0–8.0)

## 2020-02-28 MED ORDER — ALPRAZOLAM 0.5 MG PO TABS: 0.5000 mg | ORAL_TABLET | Freq: Two times a day (BID) | ORAL | 0 refills | Status: DC | PRN

## 2020-02-28 NOTE — Progress Notes (Signed)
Subjective:    Patient ID: Erin Fernandez, female    DOB: 1961-06-21, 59 y.o.   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  Has been seen virtually a menopause expert in [place] that her sister has seen.  Patient had colonoscopy by Dr. Earlean Shawl September 2012 that was normal.  Old records indicate she had previous colonoscopy in 2007 which was normal.  In 2012 she had epigastric burning despite Protonix.  She had negative H. pylori serologies through this office in 2012 as well.  Multiple medications did not improve complaints of epigastric burning including Protonix Zantac and Prilosec.  She had panendoscopy in September 2012 to evaluate diarrhea and dyspepsia. Come back for biopsy was negative.  Duodenal biopsy showed peptic inflammation with no evidence of celiac or Crohn's disease.  Cecal biopsy showed focal active colitis and rectal biopsy was normal.  She was given a trial of mesalamine-Apriso.  She had repeat colonoscopy and endoscopy July 2017 for abdominal pain and diarrhea.  She subsequently went to see a nutritionist and avoided certain foods such as gluten-containing foods and milk and that seemed to help.  In May 2012 she had abdominal ultrasound ordered by Dr. Earlean Shawl for evaluation abdominal pain and a hyperechoic lesion was noted in the right kidney.  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  She was evaluated at that time by Dr. Gaynelle Arabian for microhematuria.  Non-smoker.  Social alcohol consumption.  History of endometrial polypectomy in 2010.  She is allergic to penicillin and sulfa-both of these cause rashes.  Takes Xanax at times for insomnia.  Social history: She is a self-employed Pharmacist, community.  Patient is married to Cottie Banda, [place].  She has 1 daughter from previous marriage.  Family history: Father and brother with hypertension.  Father with history of colitis and  pacemaker.  Father has developed Alzheimer's disease.  Mother in good health.  Sister in good health.  Parents are now living at [employer].  Patient watches her diet and exercises regularly.     Review of Systems  Genitourinary:       Menopausal symptoms from time to time that have improved and have improved on estrogen replacement for menopausal specialist  Psychiatric/Behavioral:       Intermittent insomnia       Objective:   Physical Exam Blood pressure 100/80 pulse 63 temperature 97 degrees weight 134 pounds height 5 feet 6 inches BMI 21.63  Skin warm and dry.  No cervical adenopathy.  No thyromegaly.  Chest clear to auscultation.  Cardiac exam regular rate and rhythm normal S1 and S2 without murmurs or gallops.  Breast without masses.  Abdomen soft nondistended without hepatosplenomegaly masses or tenderness.  Bimanual exam normal.  Rectovaginal confirms.  Last Pap smear was 2019 and was normal. Extremities without deformities.  Neuro intact without focal deficits.  Affect thought and judgment are normal.  Labs reviewed.  Has mild elevation of LDL but total cholesterol is normal, HDL is 54 and triglycerides 82.  Continue to monitor.  Vitamin D level is normal as is c-Met and CBC.  TSH is normal.    Assessment & Plan:  Mild elevation of LDL-continue diet and exercise regimen  History of insomnia related to menopausal symptoms-is now on estrogen replacement per menopausal specialist in [place]: I have refilled Xanax 0.5 mg to take up to twice daily if needed but mainly  at bedtime if having issues sleeping.  Return in 1 year or as needed.  Immunizations are up-to-date including COVID-19 vaccine, Tdap and Shingrix vaccines.  Mammogram and bone density study ordered.

## 2020-02-28 NOTE — Patient Instructions (Signed)
Mammogram and bone density study ordered. Xanax refilled for insomnia. It was a pleasure to see you today. RTC in one year or as needed.

## 2020-03-20 ENCOUNTER — Other Ambulatory Visit: Payer: Self-pay | Admitting: Internal Medicine

## 2020-03-20 DIAGNOSIS — Z1231 Encounter for screening mammogram for malignant neoplasm of breast: Secondary | ICD-10-CM

## 2020-06-19 ENCOUNTER — Other Ambulatory Visit: Payer: Self-pay

## 2020-06-19 ENCOUNTER — Ambulatory Visit
Admission: RE | Admit: 2020-06-19 | Discharge: 2020-06-19 | Disposition: A | Discharge status: Home / Self Care | Payer: No Typology Code available for payment source | Source: Ambulatory Visit | Attending: Internal Medicine | Admitting: Internal Medicine

## 2020-06-19 ENCOUNTER — Other Ambulatory Visit (HOSPITAL_COMMUNITY): Payer: Self-pay

## 2020-06-19 ENCOUNTER — Ambulatory Visit
Admission: RE | Admit: 2020-06-19 | Discharge: 2020-06-19 | Disposition: A | Discharge status: Home / Self Care | Payer: PRIVATE HEALTH INSURANCE | Source: Ambulatory Visit | Attending: Internal Medicine | Admitting: Internal Medicine

## 2020-06-19 DIAGNOSIS — E2839 Other primary ovarian failure: Secondary | ICD-10-CM

## 2020-06-19 DIAGNOSIS — Z1231 Encounter for screening mammogram for malignant neoplasm of breast: Secondary | ICD-10-CM

## 2021-06-05 ENCOUNTER — Other Ambulatory Visit (HOSPITAL_BASED_OUTPATIENT_CLINIC_OR_DEPARTMENT_OTHER): Payer: Self-pay

## 2021-06-09 ENCOUNTER — Other Ambulatory Visit: Payer: Self-pay | Admitting: Internal Medicine

## 2021-06-09 DIAGNOSIS — Z1231 Encounter for screening mammogram for malignant neoplasm of breast: Secondary | ICD-10-CM

## 2021-07-23 ENCOUNTER — Other Ambulatory Visit: Payer: Self-pay

## 2021-07-23 ENCOUNTER — Ambulatory Visit
Admission: RE | Admit: 2021-07-23 | Discharge: 2021-07-23 | Disposition: A | Discharge status: Home / Self Care | Payer: PRIVATE HEALTH INSURANCE | Source: Ambulatory Visit

## 2021-07-23 DIAGNOSIS — Z1231 Encounter for screening mammogram for malignant neoplasm of breast: Secondary | ICD-10-CM

## 2021-09-08 ENCOUNTER — Other Ambulatory Visit: Payer: Self-pay

## 2021-09-08 ENCOUNTER — Other Ambulatory Visit (HOSPITAL_COMMUNITY): Payer: Self-pay

## 2021-09-08 ENCOUNTER — Other Ambulatory Visit: Payer: PRIVATE HEALTH INSURANCE | Admitting: Internal Medicine

## 2021-09-08 MED ORDER — DEXAMETHASONE SODIUM PHOSPHATE 100 MG/10ML IJ SOLN
INTRAMUSCULAR | 2 refills | Status: DC
  Filled 2021-09-08: qty 50, 25d supply, fill #0

## 2021-09-08 NOTE — Addendum Note (Signed)
Addended by: Jama Flavors on: 09/08/2021 09:45 AM   Modules accepted: Orders

## 2021-09-09 ENCOUNTER — Other Ambulatory Visit (HOSPITAL_COMMUNITY): Payer: Self-pay

## 2021-09-10 ENCOUNTER — Other Ambulatory Visit: Payer: PRIVATE HEALTH INSURANCE | Admitting: Internal Medicine

## 2021-09-11 ENCOUNTER — Other Ambulatory Visit: Payer: PRIVATE HEALTH INSURANCE | Admitting: Internal Medicine

## 2021-09-11 ENCOUNTER — Other Ambulatory Visit (HOSPITAL_COMMUNITY)
Admission: RE | Admit: 2021-09-11 | Discharge: 2021-09-11 | Disposition: A | Discharge status: Home / Self Care | Payer: PRIVATE HEALTH INSURANCE | Source: Ambulatory Visit | Attending: Internal Medicine | Admitting: Internal Medicine

## 2021-09-11 ENCOUNTER — Other Ambulatory Visit (HOSPITAL_COMMUNITY): Payer: Self-pay

## 2021-09-11 ENCOUNTER — Other Ambulatory Visit: Payer: Self-pay

## 2021-09-11 ENCOUNTER — Ambulatory Visit (INDEPENDENT_AMBULATORY_CARE_PROVIDER_SITE_OTHER): Payer: PRIVATE HEALTH INSURANCE | Admitting: Internal Medicine

## 2021-09-11 ENCOUNTER — Encounter: Payer: Self-pay | Admitting: Internal Medicine

## 2021-09-11 VITALS — BP 128/78 | HR 74 | Temp 98.5°F | Ht 65.5 in | Wt 138.0 lb

## 2021-09-11 DIAGNOSIS — Z0000 Encounter for general adult medical examination without abnormal findings: Secondary | ICD-10-CM | POA: Diagnosis not present

## 2021-09-11 DIAGNOSIS — Z23 Encounter for immunization: Secondary | ICD-10-CM

## 2021-09-11 DIAGNOSIS — Z124 Encounter for screening for malignant neoplasm of cervix: Secondary | ICD-10-CM

## 2021-09-11 DIAGNOSIS — R911 Solitary pulmonary nodule: Secondary | ICD-10-CM

## 2021-09-11 DIAGNOSIS — R82998 Other abnormal findings in urine: Secondary | ICD-10-CM

## 2021-09-11 DIAGNOSIS — Z1329 Encounter for screening for other suspected endocrine disorder: Secondary | ICD-10-CM

## 2021-09-11 DIAGNOSIS — E7800 Pure hypercholesterolemia, unspecified: Secondary | ICD-10-CM

## 2021-09-11 LAB — POCT URINALYSIS DIPSTICK
Bilirubin, UA: NEGATIVE
Glucose, UA: NEGATIVE
Ketones, UA: NEGATIVE
Nitrite, UA: NEGATIVE
Protein, UA: NEGATIVE
Spec Grav, UA: 1.015 (ref 1.010–1.025)
Urobilinogen, UA: NEGATIVE E.U./dL — AB
pH, UA: 6.5 (ref 5.0–8.0)

## 2021-09-11 NOTE — Patient Instructions (Addendum)
Pap smear obtained and was normal.  Tetanus immunization update given.  May have pneumococcal 20 vaccine through pharmacy.   Have annual mammogram.  Colonoscopy due 2027.  CT coronary calcium score ordered at [facility].  Bone density and mammogram ordered for the future.  As always, it was a pleasure to see you today.  Watch diet a bit.  Cholesterol is elevated.  Consider repeating lipid panel in 6 months depending on results of coronary calcium score.

## 2021-09-11 NOTE — Progress Notes (Signed)
° °  Subjective:    Patient ID: Erin Fernandez, female    DOB: 1960/12/17, 61 y.o.   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  Her general health is excellent.  Had upper endoscopy and colonoscopy by Dr. Earlean Shawl in 2017 for abdominal pain and diarrhea and both studies were normal with 10-year follow-up recommended for colonoscopy.  Old records indicates she had previous colonoscopy in 2007 which was normal.  In 2012, she had epigastric burning despite Protonix.  She had negative H. pylori serologies through this office at that time.  Multiple medications did not improve complaints of epigastric burning including Protonix, Zantac and Prilosec.  She had panendoscopy September 2012 to evaluate diarrhea and dyspepsia.  Duodenal biopsy showed peptic inflammation with no evidence of celiac or Crohn's disease.  Cecal biopsy showed focal active colitis and rectal biopsy was normal.  She was given a trial of mesalamine-Apriso.  Currently not having any issues.  Has Femring in vagina that is replaced every 3 months.  Also takes Prometrium 100 mg at bedtime.  History of endometrial polypectomy in 2010.  Have refilled Xanax which she takes sparingly for sleep .  Social history: She is a self-employed Pharmacist, community.  She is married to Cottie Banda, [place].  1 daughter from previous marriage.  Non-smoker.  Social alcohol consumption.  Family history: Father and brother with hypertension.  Father with history of colitis and pacemaker.  Father has developed Alzheimer's disease.  Mother in good health.  Sister in good health.  Patient watches her diet and exercises regularly.      Review of Systems  Constitutional: Negative.   Respiratory: Negative.    Cardiovascular: Negative.   Gastrointestinal: Negative.   Genitourinary: Negative.   Neurological: Negative.   Psychiatric/Behavioral: Negative.        Objective:   Physical Exam Blood pressure 128/78 pulse 74  temperature 98.5 degrees pulse oximetry 99% weight 138 pounds height 5 feet 5.5 inches BMI 22.62  Skin: Warm and dry.  No cervical adenopathy.  No thyromegaly.  Chest clear to auscultation.  Cardiac exam: Regular rate and rhythm without ectopy or murmurs.  Breast are without masses.  Abdomen is soft nondistended without hepatosplenomegaly masses or tenderness.  Pelvic exam is deferred to GYN physician.  No lower extremity pitting edema.  Affect thought and judgment are normal.     Assessment & Plan:  Normal health maintenance exam.   Pap smear obtained.  Tetanus immunization given.  May have pneumococcal 20 vaccine through pharmacy.  Labs show total cholesterol increasing from 186 a year ago to 220.  HDL has decreased from 54 to 45 and LDL has increased from  114 to 153. Recommend watching diet and repeat in 6 to 12 months.  A coronary calcium score has been ordered as well as a mammogram and bone density study.  CBC, c-Met are both normal.   Pap smear is normal.  Colonoscopy not due until 2027.  Trace LE on urine dipstick but urine culture showed no growth.

## 2021-09-12 LAB — CBC WITH DIFFERENTIAL/PLATELET
Absolute Monocytes: 578 cells/uL (ref 200–950)
Basophils Absolute: 39 cells/uL (ref 0–200)
Basophils Relative: 0.7 %
Eosinophils Absolute: 209 cells/uL (ref 15–500)
Eosinophils Relative: 3.8 %
HCT: 43.4 % (ref 35.0–45.0)
Hemoglobin: 14.5 g/dL (ref 11.7–15.5)
Lymphs Abs: 1584 cells/uL (ref 850–3900)
MCH: 32.1 pg (ref 27.0–33.0)
MCHC: 33.4 g/dL (ref 32.0–36.0)
MCV: 96 fL (ref 80.0–100.0)
MPV: 10.2 fL (ref 7.5–12.5)
Monocytes Relative: 10.5 %
Neutro Abs: 3091 cells/uL (ref 1500–7800)
Neutrophils Relative %: 56.2 %
Platelets: 266 10*3/uL (ref 140–400)
RBC: 4.52 10*6/uL (ref 3.80–5.10)
RDW: 12.2 % (ref 11.0–15.0)
Total Lymphocyte: 28.8 %
WBC: 5.5 10*3/uL (ref 3.8–10.8)

## 2021-09-12 LAB — COMPLETE METABOLIC PANEL WITH GFR
AG Ratio: 1.6 (calc) (ref 1.0–2.5)
ALT: 19 U/L (ref 6–29)
AST: 17 U/L (ref 10–35)
Albumin: 4.3 g/dL (ref 3.6–5.1)
Alkaline phosphatase (APISO): 57 U/L (ref 37–153)
BUN: 13 mg/dL (ref 7–25)
CO2: 27 mmol/L (ref 20–32)
Calcium: 9.4 mg/dL (ref 8.6–10.4)
Chloride: 106 mmol/L (ref 98–110)
Creat: 0.71 mg/dL (ref 0.50–1.05)
Globulin: 2.7 g/dL (calc) (ref 1.9–3.7)
Glucose, Bld: 85 mg/dL (ref 65–99)
Potassium: 4.9 mmol/L (ref 3.5–5.3)
Sodium: 142 mmol/L (ref 135–146)
Total Bilirubin: 0.5 mg/dL (ref 0.2–1.2)
Total Protein: 7 g/dL (ref 6.1–8.1)
eGFR: 97 mL/min/{1.73_m2} (ref >=60)

## 2021-09-12 LAB — TSH: TSH: 1.99 mIU/L (ref 0.40–4.50)

## 2021-09-12 LAB — LIPID PANEL
Cholesterol: 220 mg/dL — ABNORMAL HIGH (ref <200)
HDL: 45 mg/dL — ABNORMAL LOW (ref >=50)
LDL Cholesterol (Calc): 153 mg/dL (calc) — ABNORMAL HIGH
Non-HDL Cholesterol (Calc): 175 mg/dL (calc) — ABNORMAL HIGH (ref <130)
Total CHOL/HDL Ratio: 4.9 (calc) (ref <5.0)
Triglycerides: 105 mg/dL (ref <150)

## 2021-09-12 LAB — URINE CULTURE
MICRO NUMBER:: 12837828
Result:: NO GROWTH
SPECIMEN QUALITY:: ADEQUATE

## 2021-09-14 ENCOUNTER — Other Ambulatory Visit (HOSPITAL_COMMUNITY): Payer: Self-pay

## 2021-09-15 LAB — CYTOLOGY - PAP: Diagnosis: NEGATIVE

## 2021-09-17 ENCOUNTER — Other Ambulatory Visit: Payer: Self-pay

## 2021-09-17 DIAGNOSIS — Z1231 Encounter for screening mammogram for malignant neoplasm of breast: Secondary | ICD-10-CM

## 2021-09-17 DIAGNOSIS — Z136 Encounter for screening for cardiovascular disorders: Secondary | ICD-10-CM

## 2021-10-01 ENCOUNTER — Other Ambulatory Visit: Payer: Self-pay

## 2021-10-01 ENCOUNTER — Encounter (HOSPITAL_BASED_OUTPATIENT_CLINIC_OR_DEPARTMENT_OTHER): Payer: Self-pay

## 2021-10-01 ENCOUNTER — Ambulatory Visit (HOSPITAL_BASED_OUTPATIENT_CLINIC_OR_DEPARTMENT_OTHER)
Admission: RE | Admit: 2021-10-01 | Discharge: 2021-10-01 | Disposition: A | Discharge status: Home / Self Care | Payer: PRIVATE HEALTH INSURANCE | Source: Ambulatory Visit | Attending: Internal Medicine | Admitting: Internal Medicine

## 2021-10-01 DIAGNOSIS — Z136 Encounter for screening for cardiovascular disorders: Secondary | ICD-10-CM | POA: Insufficient documentation

## 2021-10-01 DIAGNOSIS — R911 Solitary pulmonary nodule: Secondary | ICD-10-CM | POA: Insufficient documentation

## 2021-10-04 MED ORDER — ALPRAZOLAM 0.5 MG PO TABS: 0.5000 mg | ORAL_TABLET | Freq: Two times a day (BID) | ORAL | 1 refills | Status: DC | PRN

## 2021-10-05 NOTE — Progress Notes (Signed)
Referral placed and authorized.

## 2021-10-05 NOTE — Addendum Note (Signed)
Addended by: Jama Flavors on: 10/05/2021 12:03 PM   Modules accepted: Orders

## 2021-10-23 ENCOUNTER — Ambulatory Visit (INDEPENDENT_AMBULATORY_CARE_PROVIDER_SITE_OTHER): Payer: PRIVATE HEALTH INSURANCE | Admitting: Pulmonary Disease

## 2021-10-23 ENCOUNTER — Other Ambulatory Visit: Payer: Self-pay

## 2021-10-23 ENCOUNTER — Encounter: Payer: Self-pay | Admitting: Pulmonary Disease

## 2021-10-23 VITALS — BP 120/70 | HR 52 | Temp 97.8°F | Ht 66.0 in | Wt 138.8 lb

## 2021-10-23 DIAGNOSIS — R918 Other nonspecific abnormal finding of lung field: Secondary | ICD-10-CM

## 2021-10-23 DIAGNOSIS — Z789 Other specified health status: Secondary | ICD-10-CM | POA: Diagnosis not present

## 2021-10-23 NOTE — Progress Notes (Signed)
Synopsis: Referred in February 2023 for lung nodule by Elby Showers, MD  Subjective:   PATIENT ID: Erin Fernandez GENDER: female DOB: Apr 11, 1961, MRN: [id number]  Chief Complaint  Patient presents with   Consult    Patient is here to talk about lesions on her lungs.     This is a 61 year old female, past medical history of GERD, referred for evaluation of lung nodule.Patient had a cardiac scoring CT completed by Dr. Renold Genta on 10/01/2021.  Cardiac scoring CT revealed 1 to 2 mm small pulmonary nodules within the right lung.  Patient is a lifelong never smoker.  Patient works as a Pharmacist, community here in town.  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  She does not have any personal history of malignancy.  No skin malignancies.  Nodules that were found were found incidentally.  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   Past Medical History:  Diagnosis Date   GERD (gastroesophageal reflux disease)      Family History  Problem Relation Age of Onset   Hypertension Father    Cholecystitis Father    Hypertension Brother    Breast cancer Neg Hx      Past Surgical History:  Procedure Laterality Date   UTERINE FIBROID SURGERY      Social History   Socioeconomic History   Marital status: Married    Spouse name: Not on file   Number of children: Not on file   Years of education: Not on file   Highest education level: Not on file  Occupational History   Not on file  Tobacco Use   Smoking status: Never   Smokeless tobacco: Never  Substance and Sexual Activity   Alcohol use: Yes    Comment: wine on weekends   Drug use: No   Sexual activity: Not on file  Other Topics Concern   Not on file  Social History Narrative   Not on file   Social Determinants of Health   Financial Resource Strain: Not on file  Food Insecurity: Not on file  Transportation Needs: Not on file  Physical Activity: Not on file  Stress: Not on file  Social  Connections: Not on file  Intimate Partner Violence: Not on file     Allergies  Allergen Reactions   Penicillins Rash   Sulfa Antibiotics Rash     Outpatient Medications Prior to Visit  Medication Sig Dispense Refill   ALPRAZolam (XANAX) 0.5 MG tablet Take 1 tablet (0.5 mg total) by mouth 2 (two) times daily as needed for anxiety. 60 tablet 1   FEMRING 0.05 MG/24HR RING UNWRAP AND INSERT 1 RING VAGINALLY ONCE EVERY 3 MONTHS (90 DAYS) THEN REMOVE AND REPLACE WITH NEW RING     progesterone (PROMETRIUM) 100 MG capsule Take 100 mg by mouth at bedtime.     No facility-administered medications prior to visit.    Review of Systems  Constitutional:  Negative for chills, fever, malaise/fatigue and weight loss.  HENT:  Negative for hearing loss, sore throat and tinnitus.   Eyes:  Negative for blurred vision and double vision.  Respiratory:  Negative for cough, hemoptysis, sputum production, shortness of breath, wheezing and stridor.   Cardiovascular:  Negative for chest pain, palpitations, orthopnea, leg swelling and PND.  Gastrointestinal:  Negative for abdominal pain, constipation, diarrhea, heartburn, nausea and vomiting.  Genitourinary:  Negative for dysuria, hematuria and urgency.  Musculoskeletal:  Negative for joint pain and myalgias.  Skin:  Negative for itching and rash.  Neurological:  Negative for dizziness, tingling, weakness and headaches.  Endo/Heme/Allergies:  Negative for environmental allergies. Does not bruise/bleed easily.  Psychiatric/Behavioral:  Negative for depression. The patient is not nervous/anxious and does not have insomnia.   All other systems reviewed and are negative.   Objective:  Physical Exam Vitals reviewed.  Constitutional:      General: She is not in acute distress.    Appearance: She is well-developed.  HENT:     Head: Normocephalic and atraumatic.  Eyes:     General: No scleral icterus.    Conjunctiva/sclera: Conjunctivae normal.     Pupils:  Pupils are equal, round, and reactive to light.  Neck:     Vascular: No JVD.     Trachea: No tracheal deviation.  Cardiovascular:     Rate and Rhythm: Normal rate and regular rhythm.     Heart sounds: Normal heart sounds. No murmur heard. Pulmonary:     Effort: Pulmonary effort is normal. No tachypnea, accessory muscle usage or respiratory distress.     Breath sounds: No stridor. No wheezing, rhonchi or rales.  Abdominal:     General: There is no distension.     Palpations: Abdomen is soft.     Tenderness: There is no abdominal tenderness.  Musculoskeletal:        General: No tenderness.     Cervical back: Neck supple.  Lymphadenopathy:     Cervical: No cervical adenopathy.  Skin:    General: Skin is warm and dry.     Capillary Refill: Capillary refill takes less than 2 seconds.     Findings: No rash.  Neurological:     Mental Status: She is alert and oriented to person, place, and time.  Psychiatric:        Behavior: Behavior normal.     Vitals:   10/23/21 1356  BP: 120/70  Pulse: (!) 52  Temp: 97.8 F (36.6 C)  TempSrc: Oral  SpO2: 100%  Weight: 138 lb 12.8 oz (63 kg)  Height: 5\' 6"  (1.676 m)   100% on RA BMI Readings from Last 3 Encounters:  10/23/21 22.40 kg/m  09/11/21 22.62 kg/m  02/28/20 21.63 kg/m   Wt Readings from Last 3 Encounters:  10/23/21 138 lb 12.8 oz (63 kg)  09/11/21 138 lb (62.6 kg)  02/28/20 134 lb (60.8 kg)     CBC    Component Value Date/Time   WBC 5.5 09/11/2021 0943   RBC 4.52 09/11/2021 0943   HGB 14.5 09/11/2021 0943   HCT 43.4 09/11/2021 0943   PLT 266 09/11/2021 0943   MCV 96.0 09/11/2021 0943   MCH 32.1 09/11/2021 0943   MCHC 33.4 09/11/2021 0943   RDW 12.2 09/11/2021 0943   LYMPHSABS 1,584 09/11/2021 0943   MONOABS 0.5 10/28/2015 0901   EOSABS 209 09/11/2021 0943   BASOSABS 39 09/11/2021 0943     Chest Imaging: January 26 cardiac scoring CT: Right lung 1 to 2 mm small pulmonary nodules. The patient's images  have been independently reviewed by me.    Pulmonary Functions Testing Results: No flowsheet data found.  FeNO:   Pathology:   Echocardiogram:   Heart Catheterization:     Assessment & Plan:     ICD-10-CM   1. Multiple pulmonary nodules  R91.8 CT CHEST HIGH RESOLUTION    2. 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  She is a lifelong non-smoker.  Plan: We will have a repeat noncontrasted CT chest in 1 year. Patient is agreeable to this plan. We reviewed her cardiac scoring CT imaging today in the office.  RTC in 1 year.    Current Outpatient Medications:    ALPRAZolam (XANAX) 0.5 MG tablet, Take 1 tablet (0.5 mg total) by mouth 2 (two) times daily as needed for anxiety., Disp: 60 tablet, Rfl: 1   FEMRING 0.05 MG/24HR RING, UNWRAP AND INSERT 1 RING VAGINALLY ONCE EVERY 3 MONTHS (90 DAYS) THEN REMOVE AND REPLACE WITH NEW RING, Disp: , Rfl:    progesterone (PROMETRIUM) 100 MG capsule, Take 100 mg by mouth at bedtime., Disp: , Rfl:    Garner Nash, DO [facility] 10/23/2021 2:34 PM

## 2021-10-23 NOTE — Patient Instructions (Signed)
Thank you for visiting Dr. Tonia Brooms at [facility]. Today we recommend the following:  Orders Placed This Encounter  Procedures   CT CHEST HIGH RESOLUTION   Return in about 1 year (around 10/23/2022) for w/ Dr. Tonia Brooms .    Please do your part to reduce the spread of COVID-19.

## 2022-04-20 ENCOUNTER — Encounter: Payer: Self-pay | Admitting: Internal Medicine

## 2022-04-22 ENCOUNTER — Other Ambulatory Visit: Payer: Self-pay

## 2022-04-22 DIAGNOSIS — Z780 Asymptomatic menopausal state: Secondary | ICD-10-CM

## 2022-05-03 ENCOUNTER — Other Ambulatory Visit: Payer: Self-pay | Admitting: Internal Medicine

## 2022-05-03 DIAGNOSIS — Z780 Asymptomatic menopausal state: Secondary | ICD-10-CM

## 2022-05-03 DIAGNOSIS — Z0000 Encounter for general adult medical examination without abnormal findings: Secondary | ICD-10-CM

## 2022-07-07 IMAGING — CT CT CARDIAC CORONARY ARTERY CALCIUM SCORE
3 series · 14 of 20 positions shown, 16 images · non-contrast
Comparison: None.
COMPARISON: None.

Addendum:
EXAM:
OVER-READ INTERPRETATION  CT CHEST

The following report is an over-read performed by radiologist Dr.
Ferienhaus Erxleben [REDACTED] on 10/01/2021. This
over-read does not include interpretation of cardiac or coronary
anatomy or pathology. The coronary calcium score interpretation by
the cardiologist is attached.
CLINICAL DATA: Cardiovascular Disease Risk stratification
Coronary Calcium Score
TECHNIQUE: A gated, non-contrast computed tomography scan of the heart was
performed using 3mm slice thickness. Axial images were analyzed on a
dedicated workstation. Calcium scoring of the coronary arteries was
performed using the Agatston method.

[Series 2: ax lung · axial · 0.69mm/px · z∈[-60,+52]mm · 5 of 86 slices shown]
[im 15/86  lung]
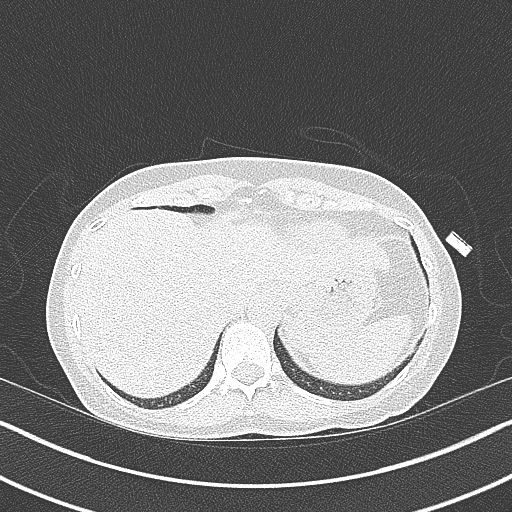
[im 29/86  lung]
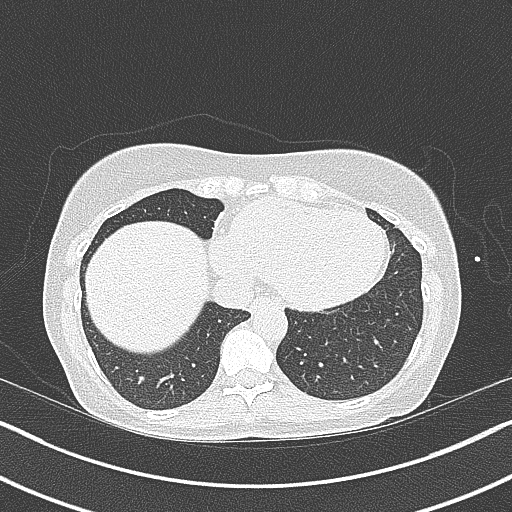
[im 43/86  lung]
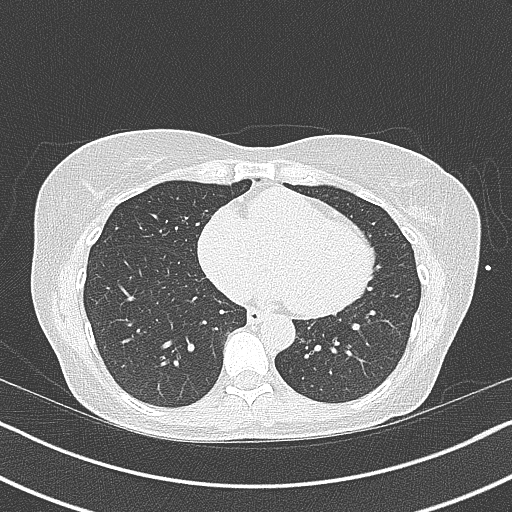
[im 57/86  lung]
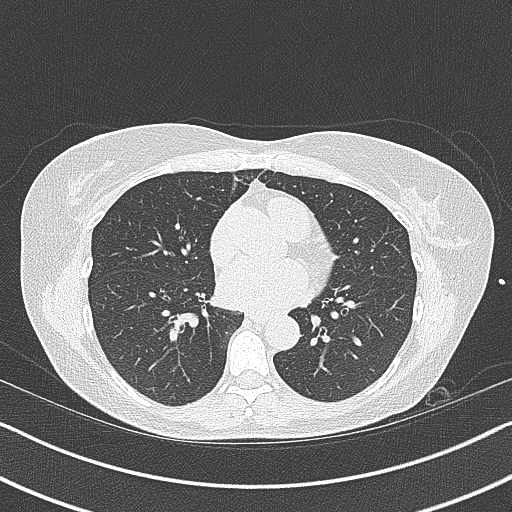
[im 71/86  lung]
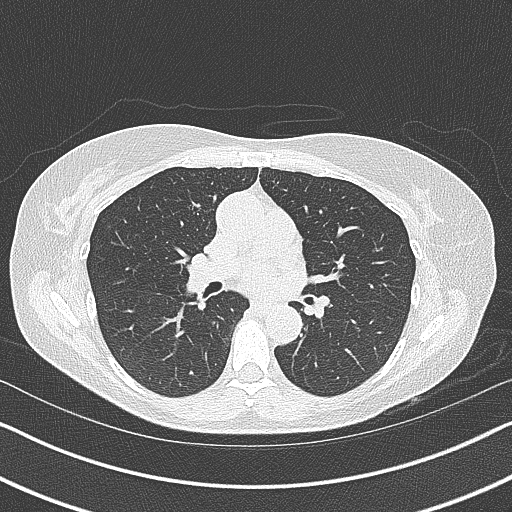

[Series 3: cascseq 3.0 sa36 70% (id) · axial · 0.39mm/px · z∈[-44,+40]mm · 3 of 57 slices shown]
[im 15/57  vessel]
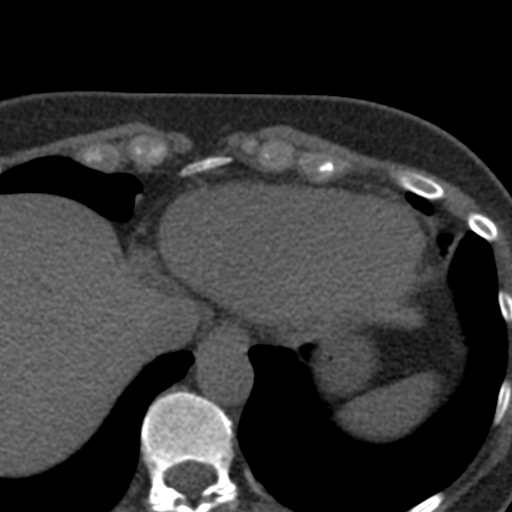
[im 29/57  vessel]
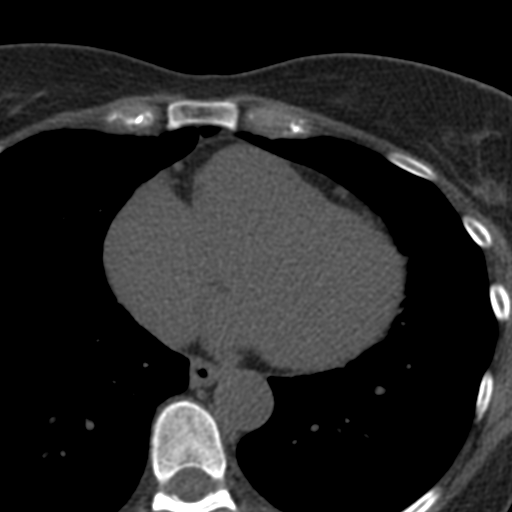
[im 43/57  vessel]
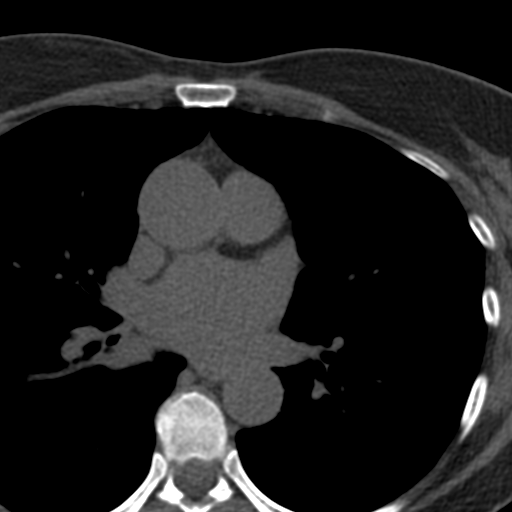

[Series 4: ax st · axial · 0.69mm/px · z∈[-64,+56]mm · 6 of 86 slices shown, 8 images]
[im 13/86  vessel]
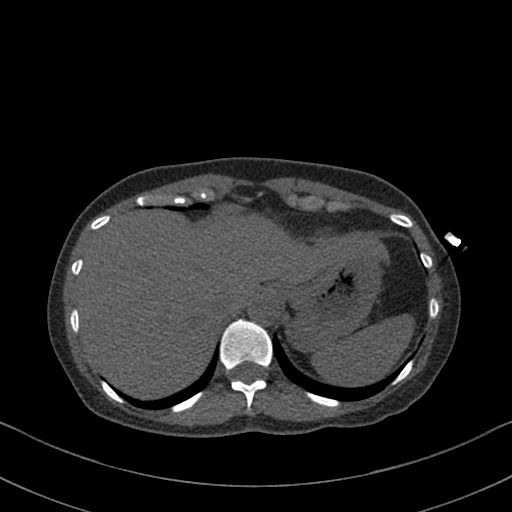
[im 13/86  lung]
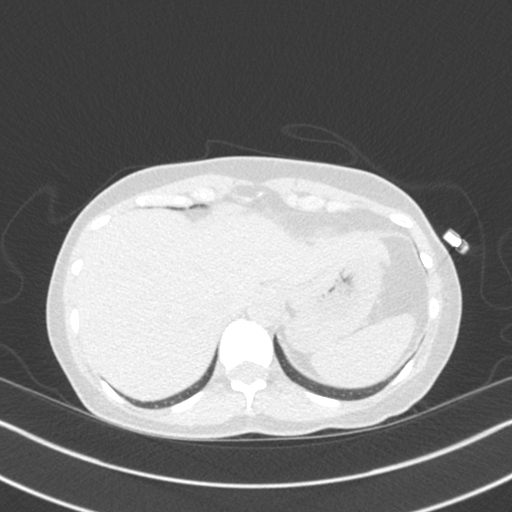
[im 25/86  vessel]
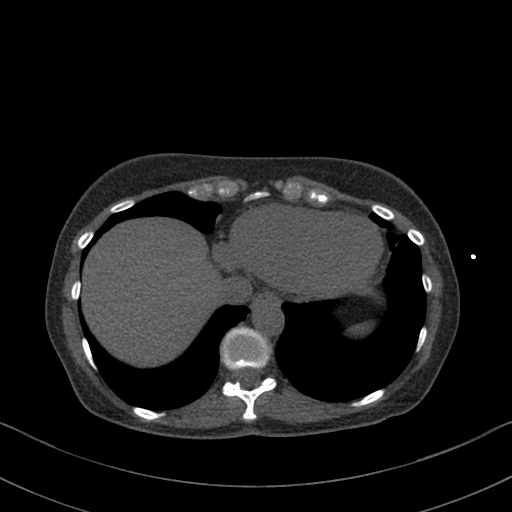
[im 37/86  vessel]
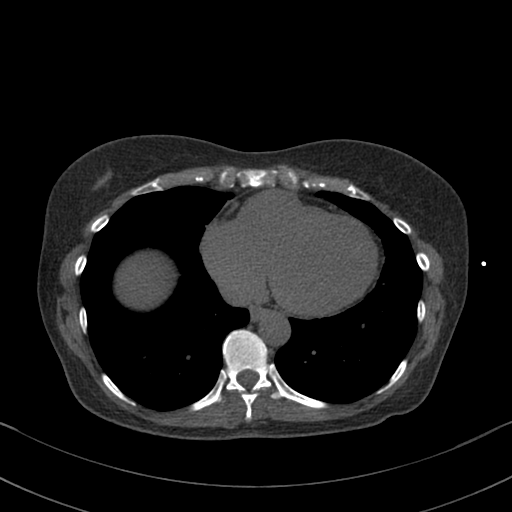
[im 49/86  vessel]
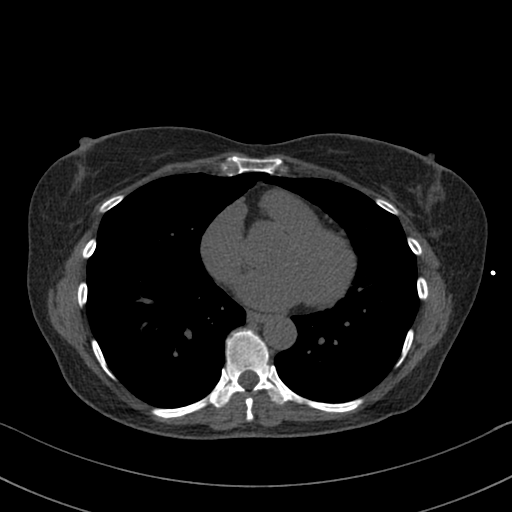
[im 61/86  vessel]
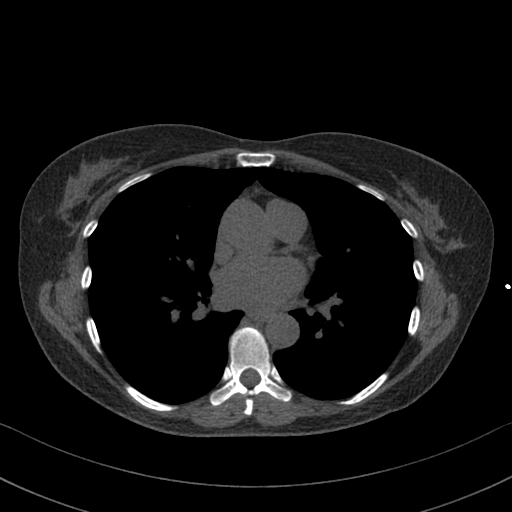
[im 61/86  lung]
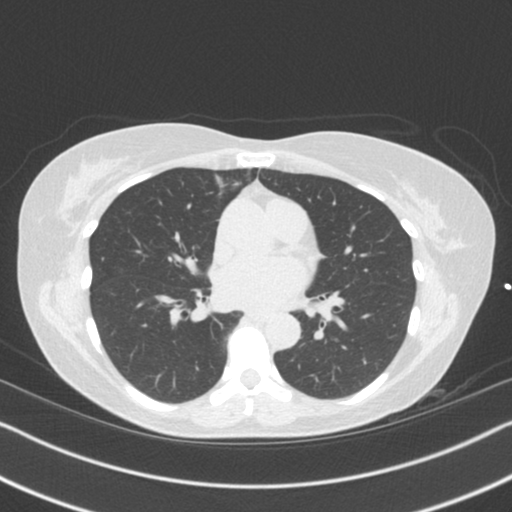
[im 73/86  vessel]
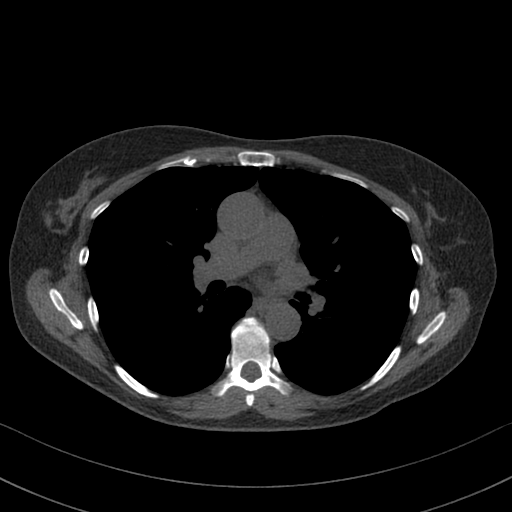

[14 of 20 positions shown; findings below may reference images not displayed]

FINDINGS: A few scattered tiny 1-2 mm pulmonary nodules are noted in the right
lung. Within the visualized portions of the thorax there are no
other larger more suspicious appearing pulmonary nodules or masses,
there is no acute consolidative airspace disease, no pleural
effusions, no pneumothorax and no lymphadenopathy. Visualized
portions of the upper abdomen are unremarkable. There are no
aggressive appearing lytic or blastic lesions noted in the
visualized portions of the skeleton.
IMPRESSION: 1. Small 1-2 mm pulmonary nodules in the right lung, nonspecific,
but statistically likely benign. No follow-up needed if patient is
low-risk (and has no known or suspected primary neoplasm).
Non-contrast chest CT can be considered in 12 months if patient is
high-risk. This recommendation follows the consensus statement:
Guidelines for Management of Incidental Pulmonary Nodules Detected
FINDINGS: Coronary arteries: Normal origins.

Coronary Calcium Score:

Left main: 0

Left anterior descending artery: 0

Left circumflex artery: 0

Right coronary artery: 0

Total: 0

Percentile: 0

Pericardium: Normal.

Aorta: Normal caliber of ascending aorta. No aortic atherosclerosis
noted.

Non-cardiac: See separate report from [REDACTED].
IMPRESSION: Coronary calcium score of 0. This was 0 percentile for age-, race-,
and sex-matched controls.



If CAC=0, it is reasonable to withhold statin therapy and reassess
in 5 to 10 years, as long as higher risk conditions are absent
(diabetes mellitus, family history of premature CHD in first degree
relatives (males <55 years; females <65 years), cigarette smoking,
or LDL >=190 mg/dL).

If CAC is 1 to 99, it is reasonable to initiate statin therapy for
patients >=55 years of age.

If CAC is >=100 or >=75th percentile, it is reasonable to initiate
statin therapy at any age.

Cardiology referral should be considered for patients with CAC
scores >=400 or >=75th percentile.

*6771 AHA/ACC/AACVPR/AAPA/ABC/TIGER/LAWI/REMIGIO/Mizzael/JHEMBOY/JOEL/JUMPER
Guideline on the Management of Blood Cholesterol: A Report of the
American College of Cardiology/American Heart Association Task Force
on Clinical Practice Guidelines. J Am Coll Cardiol.
7071;73(24):9613-9084.

*** End of Addendum ***
EXAM:
OVER-READ INTERPRETATION  CT CHEST

The following report is an over-read performed by radiologist Dr.
Ferienhaus Erxleben [REDACTED] on 10/01/2021. This
over-read does not include interpretation of cardiac or coronary
anatomy or pathology. The coronary calcium score interpretation by
the cardiologist is attached.
FINDINGS: A few scattered tiny 1-2 mm pulmonary nodules are noted in the right
lung. Within the visualized portions of the thorax there are no
other larger more suspicious appearing pulmonary nodules or masses,
there is no acute consolidative airspace disease, no pleural
effusions, no pneumothorax and no lymphadenopathy. Visualized
portions of the upper abdomen are unremarkable. There are no
aggressive appearing lytic or blastic lesions noted in the
visualized portions of the skeleton.
IMPRESSION: 1. Small 1-2 mm pulmonary nodules in the right lung, nonspecific,
but statistically likely benign. No follow-up needed if patient is
low-risk (and has no known or suspected primary neoplasm).
Non-contrast chest CT can be considered in 12 months if patient is
high-risk. This recommendation follows the consensus statement:
Guidelines for Management of Incidental Pulmonary Nodules Detected

## 2022-09-16 ENCOUNTER — Ambulatory Visit
Admission: RE | Admit: 2022-09-16 | Discharge: 2022-09-16 | Disposition: A | Discharge status: Home / Self Care | Payer: PRIVATE HEALTH INSURANCE | Source: Ambulatory Visit | Attending: Pulmonary Disease | Admitting: Pulmonary Disease

## 2022-09-16 DIAGNOSIS — R918 Other nonspecific abnormal finding of lung field: Secondary | ICD-10-CM

## 2022-09-16 DIAGNOSIS — Z789 Other specified health status: Secondary | ICD-10-CM

## 2022-09-23 ENCOUNTER — Encounter: Payer: Self-pay | Admitting: Pulmonary Disease

## 2022-09-23 ENCOUNTER — Ambulatory Visit (INDEPENDENT_AMBULATORY_CARE_PROVIDER_SITE_OTHER): Payer: PRIVATE HEALTH INSURANCE | Admitting: Pulmonary Disease

## 2022-09-23 VITALS — BP 132/80 | HR 51 | Ht 66.0 in | Wt 136.4 lb

## 2022-09-23 DIAGNOSIS — R911 Solitary pulmonary nodule: Secondary | ICD-10-CM

## 2022-09-23 NOTE — Progress Notes (Signed)
Synopsis: Referred in February 2023 for lung nodule by Margaree Mackintosh, MD  Subjective:   PATIENT ID: Erin Fernandez GENDER: female DOB: 1961/08/17, MRN: [id number]  Chief Complaint  Patient presents with   Follow-up    F/up    This is a 62 year old female, past medical history of GERD, referred for evaluation of lung nodule.Patient had a cardiac scoring CT completed by Dr. Lenord Fellers on 10/01/2021.  Cardiac scoring CT revealed 1 to 2 mm small pulmonary nodules within the right lung.  Patient is a lifelong never smoker.  Patient works as a Education officer, community here in town.  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  She does not have any personal history of malignancy.  No skin malignancies.  Nodules that were found were found incidentally.  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  OV 09/23/2022: Here today for follow-up.  She recently had a cardiac scoring CT showed incidental nodules here for 1 year CT follow-up.  Reviewed the CT imaging actually shows a new 3.3 mm nodule in the left lower lobe.  This was not documented in CT report but we reviewed images today in the office together.  Will plan for repeat noncontrast CT scan of the chest in 1 year.  Otherwise no respiratory complaints at this time.    Past Medical History:  Diagnosis Date   GERD (gastroesophageal reflux disease)      Family History  Problem Relation Age of Onset   Hypertension Father    Cholecystitis Father    Hypertension Brother    Breast cancer Neg Hx      Past Surgical History:  Procedure Laterality Date   UTERINE FIBROID SURGERY      Social History   Socioeconomic History   Marital status: Married    Spouse name: Not on file   Number of children: Not on file   Years of education: Not on file   Highest education level: Not on file  Occupational History   Not on file  Tobacco Use   Smoking status: Never   Smokeless tobacco: Never  Substance and Sexual Activity    Alcohol use: Yes    Comment: wine on weekends   Drug use: No   Sexual activity: Not on file  Other Topics Concern   Not on file  Social History Narrative   Not on file   Social Determinants of Health   Financial Resource Strain: Not on file  Food Insecurity: Not on file  Transportation Needs: Not on file  Physical Activity: Not on file  Stress: Not on file  Social Connections: Not on file  Intimate Partner Violence: Not on file     Allergies  Allergen Reactions   Penicillins Rash   Sulfa Antibiotics Rash     Outpatient Medications Prior to Visit  Medication Sig Dispense Refill   ALPRAZolam (XANAX) 0.5 MG tablet Take 1 tablet (0.5 mg total) by mouth 2 (two) times daily as needed for anxiety. (Patient not taking: Reported on 09/23/2022) 60 tablet 1   FEMRING 0.05 MG/24HR RING UNWRAP AND INSERT 1 RING VAGINALLY ONCE EVERY 3 MONTHS (90 DAYS) THEN REMOVE AND REPLACE WITH NEW RING     progesterone (PROMETRIUM) 100 MG capsule Take 100 mg by mouth at bedtime.     No facility-administered medications prior to visit.    Review of Systems  Constitutional:  Negative for chills, fever, malaise/fatigue and weight loss.  HENT:  Negative for  hearing loss, sore throat and tinnitus.   Eyes:  Negative for blurred vision and double vision.  Respiratory:  Negative for cough, hemoptysis, sputum production, shortness of breath, wheezing and stridor.   Cardiovascular:  Negative for chest pain, palpitations, orthopnea, leg swelling and PND.  Gastrointestinal:  Negative for abdominal pain, constipation, diarrhea, heartburn, nausea and vomiting.  Genitourinary:  Negative for dysuria, hematuria and urgency.  Musculoskeletal:  Negative for joint pain and myalgias.  Skin:  Negative for itching and rash.  Neurological:  Negative for dizziness, tingling, weakness and headaches.  Endo/Heme/Allergies:  Negative for environmental allergies. Does not bruise/bleed easily.  Psychiatric/Behavioral:  Negative  for depression. The patient is not nervous/anxious and does not have insomnia.   All other systems reviewed and are negative.    Objective:  Physical Exam Vitals reviewed.  Constitutional:      General: She is not in acute distress.    Appearance: She is well-developed.  HENT:     Head: Normocephalic and atraumatic.  Eyes:     General: No scleral icterus.    Conjunctiva/sclera: Conjunctivae normal.     Pupils: Pupils are equal, round, and reactive to light.  Neck:     Vascular: No JVD.     Trachea: No tracheal deviation.  Cardiovascular:     Rate and Rhythm: Normal rate and regular rhythm.     Heart sounds: Normal heart sounds. No murmur heard. Pulmonary:     Effort: Pulmonary effort is normal. No tachypnea, accessory muscle usage or respiratory distress.     Breath sounds: No stridor. No wheezing, rhonchi or rales.  Abdominal:     General: There is no distension.     Palpations: Abdomen is soft.     Tenderness: There is no abdominal tenderness.  Musculoskeletal:        General: No tenderness.     Cervical back: Neck supple.  Lymphadenopathy:     Cervical: No cervical adenopathy.  Skin:    General: Skin is warm and dry.     Capillary Refill: Capillary refill takes less than 2 seconds.     Findings: No rash.  Neurological:     Mental Status: She is alert and oriented to person, place, and time.  Psychiatric:        Behavior: Behavior normal.      Vitals:   09/23/22 0946  BP: 132/80  Pulse: (!) 51  SpO2: 98%  Weight: 136 lb 6.4 oz (61.9 kg)  Height: 5\' 6"  (1.676 m)   98% on RA BMI Readings from Last 3 Encounters:  09/23/22 22.02 kg/m  10/23/21 22.40 kg/m  09/11/21 22.62 kg/m   Wt Readings from Last 3 Encounters:  09/23/22 136 lb 6.4 oz (61.9 kg)  10/23/21 138 lb 12.8 oz (63 kg)  09/11/21 138 lb (62.6 kg)     CBC    Component Value Date/Time   WBC 5.5 09/11/2021 0943   RBC 4.52 09/11/2021 0943   HGB 14.5 09/11/2021 0943   HCT 43.4 09/11/2021  0943   PLT 266 09/11/2021 0943   MCV 96.0 09/11/2021 0943   MCH 32.1 09/11/2021 0943   MCHC 33.4 09/11/2021 0943   RDW 12.2 09/11/2021 0943   LYMPHSABS 1,584 09/11/2021 0943   MONOABS 0.5 10/28/2015 0901   EOSABS 209 09/11/2021 0943   BASOSABS 39 09/11/2021 0943     Chest Imaging: January 26 cardiac scoring CT: Right lung 1 to 2 mm small pulmonary nodules. The patient's images have been independently reviewed by  me.    January 2024 CT chest: New left lower lobe pulmonary nodule. The patient's images have been independently reviewed by me.    Pulmonary Functions Testing Results:     No data to display          FeNO:   Pathology:   Echocardiogram:   Heart Catheterization:     Assessment & Plan:     ICD-10-CM   1. Nodule of lower lobe of right lung  R91.1     2. Nodule of lower lobe of left lung  R91.1 CT Chest Wo Contrast      Discussion:  This is a 62 year old female, history of multiple pulmonary nodules.  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  She is a lifelong non-smoker.  Plan: Repeat noncontrasted CT chest in 1 year. Need to follow-up on this new nodule that was not present in the left lower lobe last year. RTC in 1 year.  After CT chest complete to see me or SG, NP.   Current Outpatient Medications:    ALPRAZolam (XANAX) 0.5 MG tablet, Take 1 tablet (0.5 mg total) by mouth 2 (two) times daily as needed for anxiety. (Patient not taking: Reported on 09/23/2022), Disp: 60 tablet, Rfl: 1   FEMRING 0.05 MG/24HR RING, UNWRAP AND INSERT 1 RING VAGINALLY ONCE EVERY 3 MONTHS (90 DAYS) THEN REMOVE AND REPLACE WITH NEW RING, Disp: , Rfl:    progesterone (PROMETRIUM) 100 MG capsule, Take 100 mg by mouth at bedtime., Disp: , Rfl:    Garner Nash, DO [facility] 09/23/2022 10:16 AM

## 2022-09-23 NOTE — Patient Instructions (Signed)
Thank you for visiting Dr. Valeta Harms at [facility]. Today we recommend the following:  Orders Placed This Encounter  Procedures   CT Chest Wo Contrast   Return in about 1 year (around 09/24/2023).    Please do your part to reduce the spread of COVID-19.

## 2022-10-14 ENCOUNTER — Ambulatory Visit
Admission: RE | Admit: 2022-10-14 | Discharge: 2022-10-14 | Disposition: A | Discharge status: Home / Self Care | Payer: PRIVATE HEALTH INSURANCE | Source: Ambulatory Visit | Attending: Internal Medicine | Admitting: Internal Medicine

## 2022-10-14 DIAGNOSIS — Z0000 Encounter for general adult medical examination without abnormal findings: Secondary | ICD-10-CM

## 2022-10-14 DIAGNOSIS — Z780 Asymptomatic menopausal state: Secondary | ICD-10-CM

## 2023-09-05 LAB — LAB REPORT - SCANNED
A1c: 5.4
Albumin, Urine POC: 0.4
EGFR: 98

## 2023-09-22 ENCOUNTER — Ambulatory Visit (HOSPITAL_BASED_OUTPATIENT_CLINIC_OR_DEPARTMENT_OTHER)
Admission: RE | Admit: 2023-09-22 | Discharge: 2023-09-22 | Disposition: A | Discharge status: Home / Self Care | Payer: PRIVATE HEALTH INSURANCE | Source: Ambulatory Visit | Attending: Pulmonary Disease | Admitting: Pulmonary Disease

## 2023-09-22 DIAGNOSIS — R911 Solitary pulmonary nodule: Secondary | ICD-10-CM | POA: Insufficient documentation

## 2023-09-29 ENCOUNTER — Ambulatory Visit: Payer: PRIVATE HEALTH INSURANCE | Admitting: Pulmonary Disease

## 2023-10-04 ENCOUNTER — Other Ambulatory Visit: Payer: Self-pay | Admitting: Internal Medicine

## 2023-10-04 DIAGNOSIS — Z1231 Encounter for screening mammogram for malignant neoplasm of breast: Secondary | ICD-10-CM

## 2023-10-14 ENCOUNTER — Ambulatory Visit (INDEPENDENT_AMBULATORY_CARE_PROVIDER_SITE_OTHER): Payer: PRIVATE HEALTH INSURANCE | Admitting: Acute Care

## 2023-10-14 ENCOUNTER — Encounter: Payer: Self-pay | Admitting: Acute Care

## 2023-10-14 VITALS — BP 114/64 | HR 64 | Temp 98.6°F | Ht 66.0 in | Wt 131.2 lb

## 2023-10-14 DIAGNOSIS — R918 Other nonspecific abnormal finding of lung field: Secondary | ICD-10-CM | POA: Diagnosis not present

## 2023-10-14 DIAGNOSIS — Z789 Other specified health status: Secondary | ICD-10-CM

## 2023-10-14 DIAGNOSIS — N281 Cyst of kidney, acquired: Secondary | ICD-10-CM

## 2023-10-14 NOTE — Patient Instructions (Signed)
 The pulmonary nodules we have been monitoring are stable. This is good news We will do a 1 year follow-up CT chest which we do in January 2026 Follow-up with me to review results If nodules are stable at this 1 year interval we can discuss whether or not we need to continue to follow these with imaging. 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 We will do an abdominal ultrasound to better evaluate You will get a call to get this scheduled Follow-up video visit with me after the scan to go over the results If the scan is normal on your MyChart it is okay to cancel the video visit. Call if you need us  sooner Please contact office for sooner follow up if symptoms do not improve or worsen or seek emergency care

## 2023-10-14 NOTE — Progress Notes (Signed)
 History of Present Illness Erin Fernandez is a 63 y.o. female never smoker Referred to  Dr. Brenna  in February 2023 for lung nodule evaluation by [name], MD.  Synopsis 63 year old female, past medical history of GERD, referred for evaluation of lung nodule.Patient had a cardiac scoring CT completed by Dr. Perri on 10/01/2021. Cardiac scoring CT revealed 1 to 2 mm small pulmonary nodules within the right lung. Patient is a lifelong never smoker. 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 She does not have any personal history of malignancy. No skin malignancies. Nodules that were found were found incidentally. 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   She was seen by Dr. Brenna 09/23/2022. Cardiac scoring scan showed a 3.3 mm nodule in the left lower lobe. Plan was for a 1 year CT Chest as surveillance of the nodule 09/2023..   10/14/2023 Pt. Presents for nodule follow up. She states she has been doing well from a respiratory stand point. No symptoms.    We have reviewed her CT Chest.Nodules we have been following are stable. 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 She will follow up ( Video visit) after the scan to review results.If needed we will consult out for follow up. Pt is in agreement with the plan.   Test Results: Chest Imaging: October 01 2021 cardiac scoring CT: Right lung 1 to 2 mm small pulmonary nodules.     January 2024 CT chest: New left lower lobe pulmonary nodule.  09/22/2023 CT Chest There is mild central bronchial thickening. Mild biapical pleural-parenchymal scarring is again noted and a calcified granuloma  laterally in the left upper lobe. There are few linear scar-like opacities in the lung bases and a slightly elevated right hemidiaphragm.   Loosely grouped tiny pleural-based right upper lobe nodules laterally, largest is 4 mm, are again noted on 5: 40-50 and a 2 mm fissural nodule in the right lower lung field on 5:101. These are unchanged in size and appearance.  Stable tiny right lung nodules, largest 4 mm. No specific follow-up recommended due to benign appearance and lack of change. For lung cancer screening, adhere to Lung-RADS guidelines. 2. Mild central bronchial thickening. 3. 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 Either follow-up ultrasound or MRI should be obtained to assess the rest of the wall of the cyst.      Latest Ref Rng & Units 09/11/2021    9:43 AM 02/15/2020    9:05 AM 11/18/2017    9:12 AM  CBC  WBC 3.8 - 10.8 Thousand/uL 5.5  5.4  5.6   Hemoglobin 11.7 - 15.5 g/dL 85.4  85.6  85.0   Hematocrit 35.0 - 45.0 % 43.4  43.5  43.6   Platelets 140 - 400 Thousand/uL 266  228  242        Latest Ref Rng & Units 09/11/2021    9:43 AM 02/15/2020    9:05 AM 11/18/2017    9:12 AM  BMP  Glucose  65 - 99 mg/dL 85  86  82   BUN 7 - 25 mg/dL 13  15  15    Creatinine 0.50 - 1.05 mg/dL 9.28  9.14  9.24   BUN/Creat Ratio 6 - 22 (calc) NOT APPLICABLE  NOT APPLICABLE  NOT APPLICABLE   Sodium 135 - 146 mmol/L 142  141  142   Potassium 3.5 - 5.3 mmol/L 4.9  4.5  4.4   Chloride 98 - 110 mmol/L 106  108  106   CO2 20 - 32 mmol/L 27  26  27    Calcium 8.6 - 10.4 mg/dL 9.4  9.4  9.9     BNP No results found for: BNP  ProBNP No results found for: PROBNP  PFT No results found for: FEV1PRE, FEV1POST, FVCPRE, FVCPOST, TLC, DLCOUNC, PREFEV1FVCRT, PSTFEV1FVCRT  CT Chest Wo Contrast Result Date: 09/30/2023 CLINICAL DATA:   Follow-up for pulmonary nodules.  Low cancer risk. EXAM: CT CHEST WITHOUT CONTRAST TECHNIQUE: Multidetector CT imaging of the chest was performed following the standard protocol without IV contrast. RADIATION DOSE REDUCTION: This exam was performed according to the departmental dose-optimization program which includes automated exposure control, adjustment of the mA and/or kV according to patient size and/or use of iterative reconstruction technique. COMPARISON:  [place]ys[facility] CT without contrast 09/16/2022, partial chest CT for coronary calcium scoring 10/01/2021. FINDINGS: Cardiovascular: The cardiac size is normal. There is no pericardial effusion. No visible coronary or aortic calcifications. Aorta and great vessels are normal in caliber and course. The pulmonary arteries and veins are normal in caliber. Mediastinum/Nodes: No enlarged mediastinal or axillary lymph nodes. Thyroid gland, trachea, and esophagus demonstrate no significant findings. Lungs/Pleura: There is mild central bronchial thickening. Mild biapical pleural-parenchymal scarring is again noted and a calcified granuloma laterally in the left upper lobe. There are few linear scar-like opacities in the lung bases and a slightly elevated right hemidiaphragm. Loosely grouped tiny pleural-based right upper lobe nodules laterally, largest is 4 mm, are again noted on 5: 40-50 and a 2 mm fissural nodule in the right lower lung field on 5:101. These are unchanged in size and appearance. No new or further nodules are seen. There is no consolidation, effusion or pneumothorax. Upper Abdomen: No acute abnormality. There is a partially visible parapelvic cyst in the superior pole of the left kidney, visualized portions uncomplicated and measures 2.9 cm and -1 Hounsfield units. 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 Either follow-up ultrasound or MRI should be obtained to assess the rest of the wall of the cyst. There  are no acute upper abdominal findings or other significant CT abnormality. Musculoskeletal: No chest wall mass or suspicious bone lesions identified. IMPRESSION: 1. Stable tiny right lung nodules, largest 4 mm. No specific follow-up recommended due to benign appearance and lack of change. For lung cancer screening, adhere to Lung-RADS guidelines. 2. Mild central bronchial thickening. 3. 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 Either follow-up ultrasound or MRI should be obtained to assess the rest of the wall of the cyst. Electronically Signed   By: Francis Quam M.D.   On: 09/30/2023 03:21     Past medical hx Past Medical History:  Diagnosis Date   GERD (gastroesophageal reflux disease)      Social History   Tobacco Use   Smoking status:  Never   Smokeless tobacco: Never  Substance Use Topics   Alcohol use: Yes    Comment: wine on weekends   Drug use: No    Ms.Rasmus reports that she has never smoked. She has never used smokeless tobacco. She reports current alcohol use. She reports that she does not use drugs.  Tobacco Cessation: Counseling given: Not Answered Never smoker   Past surgical hx, Family hx, Social hx all reviewed.  Current Outpatient Medications on File Prior to Visit  Medication Sig   FEMRING 0.05 MG/24HR RING UNWRAP AND INSERT 1 RING VAGINALLY ONCE EVERY 3 MONTHS (90 DAYS) THEN REMOVE AND REPLACE WITH NEW RING   progesterone (PROMETRIUM) 100 MG capsule Take 100 mg by mouth at bedtime.   No current facility-administered medications on file prior to visit.     Allergies  Allergen Reactions   Penicillins Rash   Sulfa Antibiotics Rash    Review Of Systems:  Constitutional:   No  weight loss, night sweats,  Fevers, chills, fatigue, or  lassitude.  HEENT:   No headaches,  Difficulty swallowing,  Tooth/dental problems, or  Sore  throat,                No sneezing, itching, ear ache, nasal congestion, post nasal drip,   CV:  No chest pain,  Orthopnea, PND, swelling in lower extremities, anasarca, dizziness, palpitations, syncope.   GI  No heartburn, indigestion, abdominal pain, nausea, vomiting, diarrhea, change in bowel habits, loss of appetite, bloody stools.   Resp: No shortness of breath with exertion or at rest.  No excess mucus, no productive cough,  No non-productive cough,  No coughing up of blood.  No change in color of mucus.  No wheezing.  No chest wall deformity  Skin: no rash or lesions.  GU: no dysuria, change in color of urine, no urgency or frequency.  No flank pain, no hematuria   MS:  No joint pain or swelling.  No decreased range of motion.  No back pain.  Psych:  No change in mood or affect. No depression or anxiety.  No memory loss.   Vital Signs BP 114/64 (BP Location: Left Arm, Patient Position: Sitting, Cuff Size: Normal)   Pulse 64   Temp 98.6 F (37 C) (Oral)   Ht 5' 6 (1.676 m)   Wt 131 lb 3.2 oz (59.5 kg)   SpO2 98%   BMI 21.18 kg/m    Physical Exam:  General- No distress,  A&Ox3, pleasant ENT: No sinus tenderness, TM clear, pale nasal mucosa, no oral exudate,no post nasal drip, no LAN Cardiac: S1, S2, regular rate and rhythm, no murmur Chest: No wheeze/ rales/ dullness; no accessory muscle use, no nasal flaring, no sternal retractions Abd.: Soft Non-tender, ND, BS +, Body mass index is 21.18 kg/m.  Ext: No clubbing cyanosis, edema Neuro:  normal strength, MAE x 4, A&O x 3 Skin: No rashes, warm and dry, no obvious lesions  Psych: normal mood and behavior   Assessment/Plan Stable Pulmonary Nodules  Never smoker Plan The pulmonary nodules we have been monitoring are stable. This is good news We will do a 1 year follow-up CT chest which we do in January 2026 Follow-up with me to review results in January 2026 If nodules are stable at this 1 year interval we can  discuss whether or not we need to continue to follow these with imaging. 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 Call if you need us  sooner Please contact office for sooner follow up if symptoms do not improve or worsen or seek emergency care     I spent 25 minutes dedicated to the care of this patient on the date of this encounter to include pre-visit review of records, face-to-face time with the patient discussing conditions above, post visit ordering of testing, clinical documentation with the electronic health record, making appropriate referrals as documented, and communicating necessary information to the patient's healthcare team.      Lauraine JULIANNA Lites, NP 10/14/2023  12:24 PM

## 2023-10-18 ENCOUNTER — Other Ambulatory Visit (HOSPITAL_BASED_OUTPATIENT_CLINIC_OR_DEPARTMENT_OTHER): Payer: PRIVATE HEALTH INSURANCE

## 2023-10-21 ENCOUNTER — Ambulatory Visit
Admission: RE | Admit: 2023-10-21 | Discharge: 2023-10-21 | Disposition: A | Discharge status: Home / Self Care | Payer: PRIVATE HEALTH INSURANCE | Source: Ambulatory Visit

## 2023-10-21 ENCOUNTER — Ambulatory Visit (HOSPITAL_BASED_OUTPATIENT_CLINIC_OR_DEPARTMENT_OTHER)
Admission: RE | Admit: 2023-10-21 | Discharge: 2023-10-21 | Disposition: A | Discharge status: Home / Self Care | Payer: PRIVATE HEALTH INSURANCE | Source: Ambulatory Visit | Attending: Acute Care | Admitting: Acute Care

## 2023-10-21 DIAGNOSIS — Z1231 Encounter for screening mammogram for malignant neoplasm of breast: Secondary | ICD-10-CM

## 2023-10-21 DIAGNOSIS — N281 Cyst of kidney, acquired: Secondary | ICD-10-CM | POA: Insufficient documentation

## 2023-10-27 ENCOUNTER — Other Ambulatory Visit: Payer: Self-pay

## 2023-10-27 DIAGNOSIS — N6320 Unspecified lump in the left breast, unspecified quadrant: Secondary | ICD-10-CM

## 2023-10-28 ENCOUNTER — Other Ambulatory Visit: Payer: PRIVATE HEALTH INSURANCE

## 2023-10-28 DIAGNOSIS — E7800 Pure hypercholesterolemia, unspecified: Secondary | ICD-10-CM

## 2023-10-28 DIAGNOSIS — N6320 Unspecified lump in the left breast, unspecified quadrant: Secondary | ICD-10-CM

## 2023-10-28 DIAGNOSIS — E785 Hyperlipidemia, unspecified: Secondary | ICD-10-CM

## 2023-10-28 DIAGNOSIS — Z780 Asymptomatic menopausal state: Secondary | ICD-10-CM

## 2023-10-28 DIAGNOSIS — Z0000 Encounter for general adult medical examination without abnormal findings: Secondary | ICD-10-CM

## 2023-10-28 DIAGNOSIS — Z1329 Encounter for screening for other suspected endocrine disorder: Secondary | ICD-10-CM

## 2023-10-29 LAB — CBC WITH DIFFERENTIAL/PLATELET
Absolute Lymphocytes: 1359 {cells}/uL (ref 850–3900)
Absolute Monocytes: 462 {cells}/uL (ref 200–950)
Basophils Absolute: 39 {cells}/uL (ref 0–200)
Basophils Relative: 0.6 %
Eosinophils Absolute: 98 {cells}/uL (ref 15–500)
Eosinophils Relative: 1.5 %
HCT: 43.8 % (ref 35.0–45.0)
Hemoglobin: 14.1 g/dL (ref 11.7–15.5)
MCH: 30.7 pg (ref 27.0–33.0)
MCHC: 32.2 g/dL (ref 32.0–36.0)
MCV: 95.4 fL (ref 80.0–100.0)
MPV: 10.4 fL (ref 7.5–12.5)
Monocytes Relative: 7.1 %
Neutro Abs: 4544 {cells}/uL (ref 1500–7800)
Neutrophils Relative %: 69.9 %
Platelets: 258 10*3/uL (ref 140–400)
RBC: 4.59 10*6/uL (ref 3.80–5.10)
RDW: 12.4 % (ref 11.0–15.0)
Total Lymphocyte: 20.9 %
WBC: 6.5 10*3/uL (ref 3.8–10.8)

## 2023-10-29 LAB — COMPLETE METABOLIC PANEL WITH GFR
AG Ratio: 2 (calc) (ref 1.0–2.5)
ALT: 13 U/L (ref 6–29)
AST: 14 U/L (ref 10–35)
Albumin: 4.7 g/dL (ref 3.6–5.1)
Alkaline phosphatase (APISO): 52 U/L (ref 37–153)
BUN: 15 mg/dL (ref 7–25)
CO2: 25 mmol/L (ref 20–32)
Calcium: 9.7 mg/dL (ref 8.6–10.4)
Chloride: 106 mmol/L (ref 98–110)
Creat: 0.77 mg/dL (ref 0.50–1.05)
Globulin: 2.3 g/dL (ref 1.9–3.7)
Glucose, Bld: 83 mg/dL (ref 65–99)
Potassium: 4.5 mmol/L (ref 3.5–5.3)
Sodium: 139 mmol/L (ref 135–146)
Total Bilirubin: 0.5 mg/dL (ref 0.2–1.2)
Total Protein: 7 g/dL (ref 6.1–8.1)
eGFR: 87 mL/min/{1.73_m2} (ref >=60)

## 2023-10-29 LAB — LIPID PANEL
Cholesterol: 201 mg/dL — ABNORMAL HIGH (ref <200)
HDL: 64 mg/dL (ref >=50)
LDL Cholesterol (Calc): 122 mg/dL — ABNORMAL HIGH
Non-HDL Cholesterol (Calc): 137 mg/dL — ABNORMAL HIGH (ref <130)
Total CHOL/HDL Ratio: 3.1 (calc) (ref <5.0)
Triglycerides: 61 mg/dL (ref <150)

## 2023-10-29 LAB — TSH: TSH: 2.85 m[IU]/L (ref 0.40–4.50)

## 2023-11-02 NOTE — Progress Notes (Incomplete)
 Annual Wellness Visit   Patient Care Team: Margaree Mackintosh, MD as PCP - General (Internal Medicine)  Visit Date: 11/03/23   Chief Complaint  Patient presents with   Annual Exam   Subjective:  Patient: Erin Fernandez, Female DOB: 25-Jun-1961, 63 y.o. MRN: [id number] Erin Fernandez is a 63 y.o. Female who presents today for her Annual Wellness Visit. Patient has history of GERD.   Discussed 10/28/2023 Lipid Panel, compared to 09/12/2021: Cholesterol 102, decreased from 220 and almost within limit; LDL 122, decreased from 153; Non-HDL 137, decreased from 175. CT Coronary Calcium Score 10/01/2021 was 0. Discussed establishing with Cardiology, recommended Dr. Rennis Golden, for further evaluation and discussion. She is agreeable.  Brings attention to her 10/21/2023 US Abdomen with hyperechoic lesion within the right kidney, not readily appreciated on these images. Mild fullness of the left renal pelvis versus a parapelvic cyst measuring 2.0 cm - concordant with prior CT imaging of the chest. Mildly grainy echotexture of the liver, suggestive of hepatic steatosis.  Reports that 08/2023 she had testing done through [facility] and  patient specifically expressed concern about her Lipase, which was elevated at 70. Discussed this at length and reassured her that these were very likely benign results with how statistically unlikely that every single lab would come back normal with multiple lab orders but will place referral for GI for further evaluation of the Elevated Lipase to reassure her nonetheless.    She was concerned about positive ANA but I am not concerned as she has no significant Joint pain and her ANA is low titer which is common in Females. Unlikely to be lupus but will draw Anti-Smith antibody which is specific for lupus.  Labs 10/28/2023 CBC: WNL CMP: WNL TSH, compared to 09/12/2021: 2.85, elevated from 1.99.   PAP Smear 09/11/2021 normal with repeat recommendation of 2026.  Mammogram  10/21/2023 noted a possible mass in the left breast with diagnostic mammogram left breast and limited L. Breast US w/Axilla for further evaluation scheduled. 2024 Mammogram normal.   Upper Endoscopy & Colonoscopy by Dr. Kinnie Scales [id number] for abdominal pain and diarrhea. Both studies were normal with 2027 follow-up recommended. Old records indicates she had previous colonoscopy 2007 which was normal. In 2012, she had epigastric burning despite Protonix. Negative H. pylori serologies through this office at that time. Multiple medications did not improve complaints of epigastric burning including Protonix, Zantac and Prilosec. Panendoscopy in September 2012 to evaluate diarrhea and dyspepsia. Duodenal biopsy showed peptic inflammation w/o evidence of celiac or Crohn's disease. Cecal biopsy showed focal active colitis and rectal biopsy was normal. She was given a trial of mesalamine-Apriso.  Bone Density 10/14/2022 T-score Femur Neck Right -0.7, normal.   Vaccine Counseling: Due for Flu and Covid-19; UTD on Shingles 2/2 and Tdap. Past Medical History:  Diagnosis Date   Allergy 1990   Penicillin   GERD (gastroesophageal reflux disease)   Medical/Surgical History Narrative:  2021 - Femring in vagina replaced every 3 months. Was also taking Prometrium 100 mg at bedtime.  2010 - Endometrial Polypectomy Family History  Problem Relation Age of Onset   Hypertension Father    Cholecystitis Father    Hypertension Brother    Hyperlipidemia Mother    Miscarriages / [place] Daughter    Breast cancer Neg Hx   Family History Narrative: Father w/ hx of Colitis, Hypertension, wears a Pacemaker, and has developed Alzheimer's Disease.  Mother in good health.  Brother with hx of Hypertension Sister in good  health.  Social History   Social History Narrative   Building surveyor. Married to Bubba Camp, DDS. 1 daughter from previous marriage. Non-smoker. Social alcohol consumption.   2024/2025 - went  to [place] in November. Daughter is [redacted] weeks pregnant.   Review of Systems  Constitutional:  Negative for chills, fever, malaise/fatigue and weight loss.  HENT:  Negative for hearing loss, sinus pain and sore throat.   Respiratory:  Negative for cough, hemoptysis and shortness of breath.   Cardiovascular:  Negative for chest pain, palpitations, leg swelling and PND.  Gastrointestinal:  Negative for abdominal pain, constipation, diarrhea, heartburn, nausea and vomiting.  Genitourinary:  Negative for dysuria, frequency and urgency.  Musculoskeletal:  Negative for back pain, myalgias and neck pain.  Skin:  Negative for itching and rash.  Neurological:  Negative for dizziness, tingling, seizures and headaches.  Endo/Heme/Allergies:  Negative for polydipsia.  Psychiatric/Behavioral:  Negative for depression. The patient is not nervous/anxious.     Objective:  Vitals: BP 110/70   Pulse 63   Ht 5\' 6"  (1.676 m)   Wt 127 lb (57.6 kg)   SpO2 98%   BMI 20.50 kg/m  Physical Exam Vitals and nursing note reviewed.  Constitutional:      General: She is not in acute distress.    Appearance: Normal appearance. She is not ill-appearing or toxic-appearing.  HENT:     Head: Normocephalic and atraumatic.     Right Ear: Hearing, tympanic membrane, ear canal and external ear normal.     Left Ear: Hearing, tympanic membrane, ear canal and external ear normal.     Mouth/Throat:     Pharynx: Oropharynx is clear.  Eyes:     Extraocular Movements: Extraocular movements intact.     Pupils: Pupils are equal, round, and reactive to light.  Neck:     Thyroid: No thyroid mass, thyromegaly or thyroid tenderness.     Vascular: No carotid bruit.  Cardiovascular:     Rate and Rhythm: Normal rate and regular rhythm. No extrasystoles are present.    Pulses:          Dorsalis pedis pulses are 1+ on the right side and 1+ on the left side.     Heart sounds: Normal heart sounds. No murmur heard.    No friction  rub. No gallop.  Pulmonary:     Effort: Pulmonary effort is normal.     Breath sounds: Normal breath sounds. No decreased breath sounds, wheezing, rhonchi or rales.  Chest:     Chest wall: No mass.  Breasts:    Right: Normal.     Left: Normal.  Abdominal:     Palpations: Abdomen is soft. There is no hepatomegaly, splenomegaly or mass.     Tenderness: There is no abdominal tenderness.     Hernia: No hernia is present.  Musculoskeletal:     Cervical back: Normal range of motion.     Right lower leg: No edema.     Left lower leg: No edema.  Lymphadenopathy:     Cervical: No cervical adenopathy.     Right cervical: No superficial cervical adenopathy.    Left cervical: No superficial cervical adenopathy.     Upper Body:     Right upper body: No supraclavicular adenopathy.     Left upper body: No supraclavicular adenopathy.  Skin:    General: Skin is warm and dry.  Neurological:     General: No focal deficit present.     Mental Status: She  is alert and oriented to person, place, and time. Mental status is at baseline.     Sensory: Sensation is intact.     Motor: Motor function is intact. No weakness.     Deep Tendon Reflexes: Reflexes are normal and symmetric.  Psychiatric:        Attention and Perception: Attention normal.        Mood and Affect: Mood normal.        Speech: Speech normal.        Behavior: Behavior normal.        Thought Content: Thought content normal.        Cognition and Memory: Cognition normal.        Judgment: Judgment normal.   Most Recent Fall Risk Assessment:    02/28/2020   11:02 AM  Fall Risk   Falls in the past year? 0  Number falls in past yr: 0  Injury with Fall? 0  Follow up Falls evaluation completed   Most Recent Depression Screenings:    11/03/2023   10:01 AM 09/11/2021    2:14 PM  PHQ 2/9 Scores  PHQ - 2 Score 0 0   Results:  Studies Obtained And Personally Reviewed By Me:  ULTRASOUND ABDOMEN COMPLETE 10/21/2023   COMPARISON: CT  chest 09/22/2023, U/S RUQ 11/28/2015  FINDINGS: The pancreas demonstrates a normal homogenous echotexture with the tail suboptimally visualized due to overlying bowel gas.   The liver demonstrates a grainy echotexture without intrahepatic biliary dilatation. No masses are visualized. The main portal vein demonstrates normal hepatopedal flow.   The gallbladder demonstrates normal anechoic echotexture without pericholecystic fluid or wall thickening. There are no gallstones. The common bile duct measures 0.3 cm. The sonographic Murphy's sign is not reported.   The right kidney measures 10.3 cm in length. Renal cortical echotexture is within normal limits. There is no hydronephrosis. There are no stones. There are no cysts. Technologist made a note of a hyperechoic lesion measuring 0.6 x 0.6 cm identified within the right kidney, however, this is not readily demonstrated on the submitted images.   The left kidney measures 10.3 cm in length. Renal cortical echotexture is within normal limits. There is no hydronephrosis. There are no stones. There are no cysts. There is mild fullness of the pelvis versus a parapelvic cyst measuring 2.0 x 1.9 cm.   The spleen measures 10.3 cm in length and demonstrates normal echotexture.   There is no evidence of aneurysm within the visualized segments of the abdominal aorta.   The visualized segments of the IVC are unremarkable.   IMPRESSION: 1. Technologist note makes mention of a hyperechoic lesion within the right kidney, not readily appreciated on these images.   2. Mild fullness of the left renal pelvis versus a parapelvic cyst measuring 2.0 cm. Concordant with prior CT imaging of the chest.   3. Mildly grainy echotexture of the liver, suggestive of hepatic steatosis.   Labs:     Component Value Date/Time   NA 139 10/28/2023 0927   K 4.5 10/28/2023 0927   CL 106 10/28/2023 0927   CO2 25 10/28/2023 0927   GLUCOSE 83 10/28/2023 0927    BUN 15 10/28/2023 0927   CREATININE 0.77 10/28/2023 0927   CALCIUM 9.7 10/28/2023 0927   PROT 7.0 10/28/2023 0927   ALBUMIN 4.5 10/28/2015 0901   AST 14 10/28/2023 0927   ALT 13 10/28/2023 0927   ALKPHOS 46 10/28/2015 0901   BILITOT 0.5 10/28/2023 0927   GFRNONAA  76 02/15/2020 0905   GFRAA 88 02/15/2020 0905    Lab Results  Component Value Date   WBC 6.5 10/28/2023   HGB 14.1 10/28/2023   HCT 43.8 10/28/2023   MCV 95.4 10/28/2023   PLT 258 10/28/2023   Lab Results  Component Value Date   CHOL 201 (H) 10/28/2023   HDL 64 10/28/2023   LDLCALC 122 (H) 10/28/2023   TRIG 61 10/28/2023   CHOLHDL 3.1 10/28/2023   Lab Results  Component Value Date   TSH 2.85 10/28/2023    Assessment & Plan:   Orders Placed This Encounter  Procedures   Anti-Smith antibody   POCT occult blood stool   POCT URINALYSIS DIP (CLINITEK)  Other Labs Reviewed today: CBC: WNL CMP: WNL TSH, compared to 09/12/2021: 2.85, elevated from 1.99.   Elevated Cholesterol on 10/28/2023 Lipid Panel, compared to 09/12/2021: Cholesterol 102, decreased from 220 and almost within limit; LDL 122, decreased from 153; Non-HDL 137, decreased from 175. CT Coronary Calcium Score 10/01/2021 was 0. Discussed establishing with Cardiology, recommended Dr. Rennis Golden, for further evaluation  Reviewed her 10/21/2023 US Abdomen with hyperechoic lesion within the right kidney, not readily appreciated on these images. Mild fullness of the left renal pelvis versus a parapelvic cyst measuring 2.0 cm - concordant with prior CT imaging of the chest. Mildly grainy echotexture of the liver, suggestive of hepatic steatosis.  Elevated Lipase- doubt pancreatitis- she would like to see a specialist- will refer to [facility]   ANA Positive: testing done through [facility] and specifically expressed concern over her Lipase, which was elevated at 70, and her POSITIVE ANA. Discussed this at length and reassured her that these were very likely  benign results with how statistically unlikely that every single lab would come back normal with multiple lab orders but will place referral for GI for further evaluation of the lipase elevation to reassure her nonetheless. Order placed for Anti-Smith Antibody which is very specific for lupus. Low titer positive ANA is common in Females.  PAP Smear 09/11/2021 normal with repeat recommendation of 2026.  Mammogram 10/21/2023 noted a possible mass in the left breast with diagnostic mammogram left breast and limited L. Breast US w/Axilla for further evaluation scheduled. 2024 Mammogram normal.   Upper Endoscopy & Colonoscopy by Dr. Kinnie Scales [id number] for abdominal pain and diarrhea. Both studies were normal with 2027 follow-up recommended. Dr. Kinnie Scales has retired.Colonoscopy in 2017 was negative.  Bone Density 10/14/2022 T-score Femur Neck Right -0.7, normal.   Vaccine Counseling: Due for Flu and Covid-19; UTD on Shingles 2/2 and Tdap.    Annual wellness visit done today including the all of the following: Reviewed patient's Family Medical History Reviewed and updated list of patient's medical providers Assessment of cognitive impairment was done Assessed patient's functional ability Established a written schedule for health screening services Health Risk Assessent Completed and Reviewed  Discussed health benefits of physical activity, and encouraged her to engage in regular exercise appropriate for her age and condition.    I,Emily Lagle,acting as a Neurosurgeon for Margaree Mackintosh, MD.,have documented all relevant documentation on the behalf of Margaree Mackintosh, MD,as directed by  Margaree Mackintosh, MD while in the presence of Margaree Mackintosh, MD.   I, Margaree Mackintosh, MD, have reviewed all documentation for this visit. The documentation on 11/03/23 for the exam, diagnosis, procedures, and orders are all accurate and complete.

## 2023-11-03 ENCOUNTER — Ambulatory Visit (INDEPENDENT_AMBULATORY_CARE_PROVIDER_SITE_OTHER): Payer: No Typology Code available for payment source | Admitting: Internal Medicine

## 2023-11-03 ENCOUNTER — Encounter: Payer: Self-pay | Admitting: Internal Medicine

## 2023-11-03 VITALS — BP 110/70 | HR 63 | Ht 66.0 in | Wt 127.0 lb

## 2023-11-03 DIAGNOSIS — R748 Abnormal levels of other serum enzymes: Secondary | ICD-10-CM

## 2023-11-03 DIAGNOSIS — Z1211 Encounter for screening for malignant neoplasm of colon: Secondary | ICD-10-CM

## 2023-11-03 DIAGNOSIS — Z0000 Encounter for general adult medical examination without abnormal findings: Secondary | ICD-10-CM | POA: Diagnosis not present

## 2023-11-03 DIAGNOSIS — R768 Other specified abnormal immunological findings in serum: Secondary | ICD-10-CM

## 2023-11-03 LAB — POCT URINALYSIS DIP (CLINITEK)
Bilirubin, UA: NEGATIVE
Blood, UA: NEGATIVE
Glucose, UA: NEGATIVE mg/dL
Ketones, POC UA: NEGATIVE mg/dL
Leukocytes, UA: NEGATIVE
Nitrite, UA: NEGATIVE
POC PROTEIN,UA: NEGATIVE
Spec Grav, UA: 1.015 (ref 1.010–1.025)
Urobilinogen, UA: 0.2 U/dL
pH, UA: 7 (ref 5.0–8.0)

## 2023-11-03 LAB — HEMOCCULT GUIAC POC 1CARD (OFFICE)
Card #1 Date: 2272025
Fecal Occult Blood, POC: NEGATIVE

## 2023-11-03 NOTE — Patient Instructions (Signed)
 Referral to GI to discuss elevated lipase without symptoms. I doubt this is serious.  Also, Positive ANA is common in Females. Have checked anti-Smith antibody which is more specific for Lupus.  RTC in one year or as needed. It was a pleasure to see you today.

## 2023-11-08 ENCOUNTER — Other Ambulatory Visit: Payer: Self-pay | Admitting: Internal Medicine

## 2023-11-08 DIAGNOSIS — R928 Other abnormal and inconclusive findings on diagnostic imaging of breast: Secondary | ICD-10-CM

## 2023-11-11 ENCOUNTER — Ambulatory Visit
Admission: RE | Admit: 2023-11-11 | Discharge: 2023-11-11 | Disposition: A | Discharge status: Home / Self Care | Payer: Self-pay | Source: Ambulatory Visit | Attending: Internal Medicine | Admitting: Internal Medicine

## 2023-11-11 DIAGNOSIS — R928 Other abnormal and inconclusive findings on diagnostic imaging of breast: Secondary | ICD-10-CM

## 2023-11-22 ENCOUNTER — Ambulatory Visit: Payer: PRIVATE HEALTH INSURANCE | Admitting: Acute Care

## 2023-11-23 ENCOUNTER — Telehealth: Payer: PRIVATE HEALTH INSURANCE | Admitting: Acute Care

## 2023-11-25 ENCOUNTER — Telehealth: Payer: PRIVATE HEALTH INSURANCE | Admitting: Acute Care

## 2023-12-28 ENCOUNTER — Encounter: Payer: Self-pay | Admitting: Gastroenterology

## 2024-03-16 ENCOUNTER — Encounter: Payer: Self-pay | Admitting: Gastroenterology

## 2024-03-16 ENCOUNTER — Ambulatory Visit: Admitting: Gastroenterology

## 2024-03-16 VITALS — BP 106/78 | HR 70 | Ht 66.0 in | Wt 129.0 lb

## 2024-03-16 DIAGNOSIS — Z1211 Encounter for screening for malignant neoplasm of colon: Secondary | ICD-10-CM

## 2024-03-16 DIAGNOSIS — E739 Lactose intolerance, unspecified: Secondary | ICD-10-CM | POA: Diagnosis not present

## 2024-03-16 DIAGNOSIS — K9041 Non-celiac gluten sensitivity: Secondary | ICD-10-CM | POA: Diagnosis not present

## 2024-03-16 DIAGNOSIS — R748 Abnormal levels of other serum enzymes: Secondary | ICD-10-CM

## 2024-03-16 NOTE — Progress Notes (Unsigned)
 GASTROENTEROLOGY OUTPATIENT CLINIC VISIT   Primary Care Provider Baxley, Ronal PARAS, MD [address] [facility] [place] [address] [phone]  Referring Provider Baxley, Ronal PARAS, MD [address] [place],  [place] [address] [phone]  Patient Profile: Erin Fernandez is a 63 y.o. female without significant PMH.  The patient presents to the [facility] for an evaluation and management of problem(s) noted below:  Problem List 1. Elevated lipase   2. Colon cancer screening   3. Gluten intolerance   4. Lactose intolerance    Discussed the use of AI scribe software for clinical note transcription with the patient, who gave verbal consent to proceed.  History of Present Illness Dr. 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  Over the past two years, she has had [facility] performed in effort of being proactive in her health.  These have shown elevations in lipase (ULN = 60) of 62 -> 72 -> 70 (last in 12/24 check).  She denies any symptoms of abdominal pain or discomfort.  She does have infrequent episodes of pyrosis/GERD attributable to dietary indiscretions or increased wine intake during periods of the year (more so in holiday season).  Denies overt dysphagia symptoms.  She underwent a colonoscopy in 2017 with a ten-year recall and an upper endoscopy with normal results, including biopsies of the small intestine (per her report negative for celiac and she was on gluten at the time).  She feels that she is Gluten sensitive so tries to minimize intake if possible but is not totally GFD.  She is dairy-free as well.  These changes overall have improved her bowel movements, although they are not always solid.  Denies any blood in her stools.  She has been proactive in her health management, addressing low omega-3 levels and other health markers  through supplements.  She, however, makes mention that none of the supplements that she is currently on were being taken before the initial elevated Lipase level that was found (omega-3, magnesium, calcium, strontium, ashwagandha, and a mercury chelator).   GI Review of Systems Positive as above Negative for dysphagia, odynophagia, nausea, vomiting, melena, hematochezia  Review of Systems General: Denies fevers/chills/weight loss unintentionally Cardiovascular: Denies chest pain Pulmonary: Denies shortness of breath Gastroenterological: See HPI Genitourinary: Denies darkened urine Hematological: Denies easy bruising/bleeding Dermatological: Denies jaundice Psychological: Mood is stable  Medications Current Outpatient Medications  Medication Sig Dispense Refill   AMBULATORY NON FORMULARY MEDICATION daily. Medication Name: mercury chelator compound     AMBULATORY NON FORMULARY MEDICATION 1 tablet daily. Medication Name: strontium     ASHWAGANDHA PO Take 1 tablet by mouth daily.     CALCIUM PO Take 1 tablet by mouth 2 (two) times daily.     FEMRING 0.05 MG/24HR RING UNWRAP AND INSERT 1 RING VAGINALLY ONCE EVERY 3 MONTHS (90 DAYS) THEN REMOVE AND REPLACE WITH NEW RING     MAGNESIUM PO Take 1 tablet by mouth 2 (two) times daily.     Omega-3 Fatty Acids (OMEGA 3 PO) Take 1 capsule by mouth daily.     progesterone (PROMETRIUM) 100 MG capsule Take 100 mg by mouth at bedtime.     No current facility-administered medications for this visit.    Allergies Allergies  Allergen Reactions   Penicillins Rash   Sulfa Antibiotics Rash    Histories Past Medical History:  Diagnosis Date   Allergy 1990  Penicillin   GERD (gastroesophageal reflux disease)    Past Surgical History:  Procedure Laterality Date   UTERINE FIBROID SURGERY     Social History   Socioeconomic History   Marital status: Married    Spouse name: Not on file   Number of children: Not on file   Years of education:  Not on file   Highest education level: Professional school degree (e.g., MD, DDS, DVM, JD)  Occupational History   Not on file  Tobacco Use   Smoking status: Never   Smokeless tobacco: Never  Vaping Use   Vaping status: Never Used  Substance and Sexual Activity   Alcohol use: Yes    Alcohol/week: 3.0 standard drinks of alcohol    Types: 3 Glasses of wine per week    Comment: wine on weekends   Drug use: No   Sexual activity: Yes    Birth control/protection: Post-menopausal  Other Topics Concern   Not on file  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 1 daughter from previous marriage. Non-smoker. Social alcohol consumption.   2024/2025 - went to [place] in November. Daughter is [redacted] weeks pregnant.   Social Drivers of Corporate investment banker Strain: Low Risk  (10/30/2023)   Overall Financial Resource Strain (CARDIA)    Difficulty of Paying Living Expenses: Not hard at all  Food Insecurity: No Food Insecurity (10/30/2023)   Hunger Vital Sign    Worried About Running Out of Food in the Last Year: Never true    Ran Out of Food in the Last Year: Never true  Transportation Needs: No Transportation Needs (10/30/2023)   PRAPARE - Administrator, Civil Service (Medical): No    Lack of Transportation (Non-Medical): No  Physical Activity: Sufficiently Active (10/30/2023)   Exercise Vital Sign    Days of Exercise per Week: 5 days    Minutes of Exercise per Session: 40 min  Stress: No Stress Concern Present (10/30/2023)   [employer] of Occupational Health - Occupational Stress Questionnaire    Feeling of Stress : Only a little  Social Connections: Moderately Integrated (10/30/2023)   Social Connection and Isolation Panel    Frequency of Communication with Friends and Family: More than three times a week    Frequency of Social Gatherings with Friends and Family: Twice a week    Attends Religious Services: Never    Loss adjuster, chartered or Organizations: Yes    Attends Engineer, structural: 1 to 4 times per year    Marital Status: Married  Catering manager Violence: Not on file   Family History  Problem Relation Age of Onset   Hyperlipidemia Mother    Colon polyps Mother    Hypertension Father    Cholecystitis Father    Diverticulitis Father    Ulcerative colitis Father    Hypertension Brother    Irritable bowel syndrome Paternal Aunt    Breast cancer Maternal Grandmother    Liver cancer Paternal Grandmother    Miscarriages / [place] Daughter    Colon cancer Neg Hx    Esophageal cancer Neg Hx    Inflammatory bowel disease Neg Hx    Liver disease Neg Hx    Pancreatic cancer Neg Hx    Rectal cancer Neg Hx    Stomach cancer Neg Hx    I have reviewed her medical, social, and family history in detail and updated the electronic medical record as necessary.  PHYSICAL EXAMINATION  BP 106/78   Pulse 70   Ht 5' 6 (1.676 m)   Wt 129 lb (58.5 kg)   BMI 20.82 kg/m  Wt Readings from Last 3 Encounters:  03/16/24 129 lb (58.5 kg)  11/03/23 127 lb (57.6 kg)  10/14/23 131 lb 3.2 oz (59.5 kg)  GEN: NAD, appears stated age, doesn't appear chronically ill PSYCH: Cooperative, without pressured speech EYE: Conjunctivae pink, sclerae anicteric ENT: MMM CV: Nontachycardic RESP: No audible wheezing GI: NABS, soft, ND, without rebound MSK/EXT: No significant lower extremity edema SKIN: No jaundice NEURO:  Alert & Oriented x 3, no focal deficits   REVIEW OF DATA  I reviewed the following data at the time of this encounter:  GI Procedures and Studies  2017 EGD Esophagus appeared normal.  No evidence of stricture/web/ring.  Z-line 40 cm.  No obvious hiatal hernia. The stomach was normal. The pylorus was symmetric and traversed. Duodenal bulb and second portion were normal.  Biopsies obtained to rule out celiac.  2017 colonoscopy Normal ileoscopy examination. Normal colonoscopic  examination. Repeat colonoscopy in 10 years.  Laboratory Studies  Reviewed those in epic  [facility] report Amylase 52 Lipase 70 (60 upper limit of normal)  Imaging Studies  February 2025 abdominal ultrasound IMPRESSION: 1. Technologist note makes mention of a hyperechoic lesion within the right kidney, not readily appreciated on these images. 2. Mild fullness of the left renal pelvis versus a parapelvic cyst measuring 2.0 cm. Concordant with prior CT imaging of the chest. 3. Mildly grainy echotexture of the liver, suggestive of hepatic steatosis.   ASSESSMENT  Ms. Governale is a 63 y.o. female without significant PMH.  The patient is seen today for evaluation and management of:  1. Elevated lipase   2. Colon cancer screening   3. Gluten intolerance   4. Lactose intolerance    The patient is hemodynamically clinically stable at this time.  Although she has had a very slight elevation in her lipase levels associated amylase levels have not been elevated at the same time.  She gives no history of overt pancreatitis like discomfort having occurred in the past.  I do think that this will likely end up being just physiologic.  With that being said, if she continues to have elevations in her lipase when rechecked later this year, then I would go ahead and recommended cross-sectional CT abdomen/pelvis to ensure that the pancreas itself is still looking and doing well as expected.  If it is normalized, then we will hold on cross-sectional imaging.  She has done good things for herself in regards to trying to mindful of the foods that she is eating and made adjustments in her diet in the past.  She will be due for colon cancer screening in 2027 and we will place a recall in the system.  She will send us  an update with the results of her laboratories later this year.  All patient questions were answered to the best of my ability, and the patient agrees to the aforementioned plan  of action with follow-up as indicated.   PLAN  Patient will update us  with laboratories from later this year If lipase elevation above upper limit of normal still present, we will plan to proceed with CT abdomen/pelvis with IV and oral contrast to evaluate the pancreas more fully If lipase is not elevated above upper limit of normal then will hold on further imaging Colonoscopy for colon cancer screening 2027 recall placed Follow-up as  needed   No orders of the defined types were placed in this encounter.   New Prescriptions   No medications on file   Modified Medications   No medications on file    Planned Follow Up No follow-ups on file.   Total Time in Face-to-Face and in Coordination of Care for patient including independent/personal interpretation/review of prior testing, medical history, examination, medication adjustment, communicating results with the patient directly, and documentation within the EHR is 45 minutes.SABRA Aloha Finner, MD [facility] Office # [phone]

## 2024-03-16 NOTE — Patient Instructions (Signed)
 Please send my chart message in August with your lab results. At that time if your Lipase is elevated Dr Wilhelmenia may consider CT scan.  You will be due for a recall colonoscopy in 2027. We will send you a reminder in the mail when it gets closer to that time.  _______________________________________________________  If your blood pressure at your visit was 140/90 or greater, please contact your primary care physician to follow up on this.  _______________________________________________________  If you are age 69 or older, your body mass index should be between 23-30. Your Body mass index is 20.82 kg/m. If this is out of the aforementioned range listed, please consider follow up with your Primary Care Provider.  If you are age 29 or younger, your body mass index should be between 19-25. Your Body mass index is 20.82 kg/m. If this is out of the aformentioned range listed, please consider follow up with your Primary Care Provider.   ________________________________________________________  The [facility] providers would like to encourage you to use MYCHART to communicate with providers for non-urgent requests or questions.  Due to long hold times on the telephone, sending your provider a message by [facility] may be a faster and more efficient way to get a response.  Please allow 48 business hours for a response.  Please remember that this is for non-urgent requests.  _______________________________________________________  Thank you for choosing me and  Gastroenterology.  Dr. Wilhelmenia

## 2024-03-19 ENCOUNTER — Encounter: Payer: Self-pay | Admitting: Gastroenterology

## 2024-03-19 DIAGNOSIS — E739 Lactose intolerance, unspecified: Secondary | ICD-10-CM | POA: Insufficient documentation

## 2024-03-19 DIAGNOSIS — Z1211 Encounter for screening for malignant neoplasm of colon: Secondary | ICD-10-CM | POA: Insufficient documentation

## 2024-03-19 DIAGNOSIS — K9041 Non-celiac gluten sensitivity: Secondary | ICD-10-CM | POA: Insufficient documentation

## 2024-03-19 DIAGNOSIS — R748 Abnormal levels of other serum enzymes: Secondary | ICD-10-CM | POA: Insufficient documentation

## 2024-06-11 ENCOUNTER — Encounter: Payer: Self-pay | Admitting: Gastroenterology

## 2024-09-27 ENCOUNTER — Ambulatory Visit (HOSPITAL_BASED_OUTPATIENT_CLINIC_OR_DEPARTMENT_OTHER)
Admission: RE | Admit: 2024-09-27 | Discharge: 2024-09-27 | Disposition: A | Discharge status: Home / Self Care | Source: Ambulatory Visit | Attending: Acute Care | Admitting: Acute Care

## 2024-09-27 DIAGNOSIS — R918 Other nonspecific abnormal finding of lung field: Secondary | ICD-10-CM | POA: Insufficient documentation

## 2024-10-01 ENCOUNTER — Ambulatory Visit (HOSPITAL_BASED_OUTPATIENT_CLINIC_OR_DEPARTMENT_OTHER)

## 2024-10-10 ENCOUNTER — Ambulatory Visit: Admitting: Acute Care

## 2024-10-12 ENCOUNTER — Ambulatory Visit: Admitting: Acute Care

## 2024-10-12 ENCOUNTER — Encounter: Payer: Self-pay | Admitting: Acute Care

## 2024-10-12 VITALS — BP 113/75 | HR 60 | Temp 99.0°F | Ht 66.0 in | Wt 133.6 lb

## 2024-10-12 DIAGNOSIS — R911 Solitary pulmonary nodule: Secondary | ICD-10-CM

## 2024-10-12 DIAGNOSIS — I517 Cardiomegaly: Secondary | ICD-10-CM

## 2024-10-12 NOTE — Patient Instructions (Addendum)
 It is good to see you today. You scan shows the pulmonary nodule we are monitoring is stable. We discussed annual CT Chest vs 2 year CT Chest. You prefer CT Chest in 2 years, so 09/2026. You will follow up with me after the scan to review results. The CT Chest did show mild cardiomegaly which is a new finding.  The means your heart is slightly enlarged. You are asymptomatic, which is reassuring. Please talk with Dr. Perri in March about a cardiac echo to further evaluate the finding. Call to be seen sooner for any blood in your sputum when you cough, or for unexplained weight loss.  Call if you need us . Stay warm out there. Please contact office for sooner follow up if symptoms do not improve or worsen or seek emergency care

## 2024-10-12 NOTE — Progress Notes (Signed)
 "  History of Present Illness Erin Fernandez is a 65 y.o. female never smoker Referred to  Dr. Brenna  in February 2023 for lung nodule evaluation by Erin Ronal PARAS, MD.    10/12/2024 Discussed the use of AI scribe software for clinical note transcription with the patient, who gave verbal consent to proceed.  Synopsis 63 year old female, past medical history of GERD, referred for evaluation of lung nodule.Patient had a cardiac scoring CT completed by Dr. Perri on 10/01/2021. Cardiac scoring CT revealed 1 to 2 mm small pulmonary nodules within the right lung. Patient is a lifelong never smoker. 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 She does not have any personal history of malignancy. No skin malignancies. Nodules that were found were found incidentally. 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    She was seen by Dr. Brenna 09/23/2022. Cardiac scoring scan showed a 3.3 mm nodule in the left lower lobe. After her 09/2023 scan, which was normal, plan was for a 1 year CT Chest as surveillance of the nodule 09/2024. She is here to review results of surveillance scan  History of Present Illness Patient presents for follow-up to review CT chest as surveillance of the pulmonary nodule.  Patient states has been doing well.  No hemoptysis or unexplained weight loss.  No physical complaints at all.  We have reviewed the results of the CT chest done 09/27/2024.  The scan shows a stable 3 mm right lower lobe nodule.  This has been stable since 2024.  This completes 2 years of stability.  I gave Erin Fernandez the option of continue annual scans.  She prefers to do by annual scans as she will be on Medicare in 2028.  There was a new finding of mild cardiomegaly.  Patient does not have a significant history of heart disease in her family.  She does however have elevated cholesterol.  Her father did have a history of hypertension.  We  discussed 2D echo and cardiology consult.  Patient states she would prefer to discuss this with Dr. Perri in March.  If Dr. Perri feels this is appropriate she will order an echo and follow-up.  Patient verbalized understanding of the above.  We will see her in January 2028 as surveillance of the pulmonary nodule in the right lower lobe.  I have asked her to call to be seen sooner if she develops any hemoptysis or has unexplained weight loss.  She verbalized understanding.     Test Results: CT chest 09/27/2024 Cardiovascular: Mild cardiomegaly. No pericardial effusion. No aortic aneurysm.  Lungs/Pleura: The central tracheo-bronchial tree is patent. There are patchy areas of linear, plate-like atelectasis and/or scarring throughout bilateral lungs. No mass or consolidation. No pleural effusion or pneumothorax. There is a stable 3 mm right lower lobe perifissural nodule (series 2004, image 96). There are stable pleural-based triangular opacities in the right upper lobe (series IMPRESSION: 1. Stable right lung nodules, as described above. No new or suspicious lung nodule. 2. Otherwise essentially unremarkable exam, as described above. 2004, images 31-41). No new or suspicious lung nodule.    Latest Ref Rng & Units 10/28/2023    9:27 AM 09/11/2021    9:43 AM 02/15/2020    9:05 AM  CBC  WBC 3.8 - 10.8 Thousand/uL 6.5  5.5  5.4   Hemoglobin 11.7 - 15.5 g/dL 85.8  85.4  85.6   Hematocrit  35.0 - 45.0 % 43.8  43.4  43.5   Platelets 140 - 400 Thousand/uL 258  266  228        Latest Ref Rng & Units 10/28/2023    9:27 AM 09/11/2021    9:43 AM 02/15/2020    9:05 AM  BMP  Glucose 65 - 99 mg/dL 83  85  86   BUN 7 - 25 mg/dL 15  13  15    Creatinine 0.50 - 1.05 mg/dL 9.22  9.28  9.14   BUN/Creat Ratio 6 - 22 (calc) SEE NOTE:  NOT APPLICABLE  NOT APPLICABLE   Sodium 135 - 146 mmol/L 139  142  141   Potassium 3.5 - 5.3 mmol/L 4.5  4.9  4.5   Chloride 98 - 110 mmol/L 106  106  108   CO2 20 - 32  mmol/L 25  27  26    Calcium 8.6 - 10.4 mg/dL 9.7  9.4  9.4     BNP No results found for: BNP  ProBNP No results found for: PROBNP  PFT No results found for: FEV1PRE, FEV1POST, FVCPRE, FVCPOST, TLC, DLCOUNC, PREFEV1FVCRT, PSTFEV1FVCRT  CT CHEST WO CONTRAST Result Date: 09/27/2024 CLINICAL DATA:  Lung nodule, < 6mm, high cancer risk. EXAM: CT CHEST WITHOUT CONTRAST TECHNIQUE: Multidetector CT imaging of the chest was performed following the standard protocol without IV contrast. RADIATION DOSE REDUCTION: This exam was performed according to the departmental dose-optimization program which includes automated exposure control, adjustment of the mA and/or kV according to patient size and/or use of iterative reconstruction technique. COMPARISON:  CT scan chest from 09/22/2023. FINDINGS: Cardiovascular: Mild cardiomegaly. No pericardial effusion. No aortic aneurysm. Mediastinum/Nodes: Visualized thyroid gland appears grossly unremarkable. No solid / cystic mediastinal masses. The esophagus is nondistended precluding optimal assessment. There are few mildly prominent mediastinal lymph nodes, which do not meet the size criteria for lymphadenopathy and appear grossly similar to the prior study, favoring benign etiology. No axillary lymphadenopathy by size criteria. Evaluation of bilateral hila is limited due to lack on intravenous contrast: however, no large hilar lymphadenopathy identified. Lungs/Pleura: The central tracheo-bronchial tree is patent. There are patchy areas of linear, plate-like atelectasis and/or scarring throughout bilateral lungs. No mass or consolidation. No pleural effusion or pneumothorax. There is a stable 3 mm right lower lobe perifissural nodule (series 2004, image 96). There are stable pleural-based triangular opacities in the right upper lobe (series 2004, images 31-41). No new or suspicious lung nodule. Upper Abdomen: Visualized upper abdominal viscera within  normal limits. Redemonstration of partially imaged probable left extrarenal pelvis, unchanged since the prior MRI from 2012. Musculoskeletal: The visualized soft tissues of the chest wall are grossly unremarkable. No suspicious osseous lesions. IMPRESSION: 1. Stable right lung nodules, as described above. No new or suspicious lung nodule. 2. Otherwise essentially unremarkable exam, as described above. Electronically Signed   By: Ree Molt M.D.   On: 09/27/2024 10:53     Past medical hx Past Medical History:  Diagnosis Date   Allergy 1990   Penicillin   GERD (gastroesophageal reflux disease)      Social History[1]  ErinStrum reports that she has never smoked. She has never been exposed to tobacco smoke. She has never used smokeless tobacco. She reports current alcohol use of about 3.0 standard drinks of alcohol per week. She reports that she does not use drugs.  Tobacco Cessation: Counseling given: Not Answered Never smoker   Past surgical hx, Family hx, Social hx all reviewed.  Current  Outpatient Medications on File Prior to Visit  Medication Sig   ASHWAGANDHA PO Take 1 tablet by mouth daily.   bimatoprost (LUMIGAN) 0.03 % ophthalmic solution SMARTSIG:In Eye(s)   FEMRING 0.1 MG/24HR RING    MAGNESIUM PO Take 1 tablet by mouth 2 (two) times daily.   Omega-3 Fatty Acids (OMEGA 3 PO) Take 1 capsule by mouth daily.   progesterone (PROMETRIUM) 200 MG capsule Take 200 mg by mouth at bedtime.   No current facility-administered medications on file prior to visit.     Allergies[2]  Review Of Systems:  Constitutional:   No  weight loss, night sweats,  Fevers, chills, fatigue, or  lassitude.  HEENT:   No headaches,  Difficulty swallowing,  Tooth/dental problems, or  Sore throat,                No sneezing, itching, ear ache, nasal congestion, post nasal drip,   CV:  No chest pain,  Orthopnea, PND, swelling in lower extremities, anasarca, dizziness, palpitations, syncope.   GI   No heartburn, indigestion, abdominal pain, nausea, vomiting, diarrhea, change in bowel habits, loss of appetite, bloody stools.   Resp: No shortness of breath with exertion or at rest.  No excess mucus, no productive cough,  No non-productive cough,  No coughing up of blood.  No change in color of mucus.  No wheezing.  No chest wall deformity  Skin: no rash or lesions.  GU: no dysuria, change in color of urine, no urgency or frequency.  No flank pain, no hematuria   MS:  No joint pain or swelling.  No decreased range of motion.  No back pain.  Psych:  No change in mood or affect. No depression or anxiety.  No memory loss.   Vital Signs BP 113/75   Pulse 60   Temp 99 F (37.2 C) (Oral)   Ht 5' 6 (1.676 m)   Wt 133 lb 9.6 oz (60.6 kg)   SpO2 100%   BMI 21.56 kg/m    Physical Exam:  General- No distress,  A&Ox3, pleasant and appropriate ENT: No sinus tenderness, TM clear, pale nasal mucosa, no oral exudate,no post nasal drip, no LAN Cardiac: S1, S2, regular rate and rhythm, no murmur Chest: No wheeze/ rales/ dullness; no accessory muscle use, no nasal flaring, no sternal retractions Abd.: Soft Non-tender, ND, BS +, Body mass index is 21.56 kg/m.  Ext: No clubbing cyanosis, edema Neuro:  normal strength, MAE x 4, A&O x 3  Skin: No rashes, warm and dry, no obvious skin lesions  Psych: normal mood and behavior  Physical Exam    Assessment/Plan Stable pulmonary nodule in a never smoker Solid nodule shows 2 years of stability on surveillance. No unintentional weight loss or hemoptysis Plan CT chest 09/2026 as surveillance Follow up with me 1 week after the scan to review results Call for any blood in your sputum when you cough, or for unexplained weight loss.   Mild Cardiomegally>> New finding Mildly Elevated cholesterol Family Hx.of elevated lipids and HTN Plan Please talk with Dr. Perri in March about a cardiac echo to further evaluate this finding. Consider  cardiology referral if echo is abnormal  I spent 20 minutes dedicated to the care of this patient on the date of this encounter to include pre-visit review of records, face-to-face time with the patient discussing conditions above, post visit ordering of testing, clinical documentation with the electronic health record, making appropriate referrals as documented, and communicating necessary information to  the patient's healthcare team.    Assessment & Plan        Erin JULIANNA Lites, NP 10/12/2024  10:04 AM             [1]  Social History Tobacco Use   Smoking status: Never    Passive exposure: Never   Smokeless tobacco: Never  Vaping Use   Vaping status: Never Used  Substance Use Topics   Alcohol use: Yes    Alcohol/week: 3.0 standard drinks of alcohol    Types: 3 Glasses of wine per week    Comment: wine on weekends   Drug use: No  [2]  Allergies Allergen Reactions   Penicillins Rash   Sulfa Antibiotics Rash   "

## 2024-11-01 ENCOUNTER — Other Ambulatory Visit: Payer: Self-pay

## 2024-11-09 ENCOUNTER — Encounter: Payer: Self-pay | Admitting: Internal Medicine

## 2024-11-15 ENCOUNTER — Encounter

## 2024-11-15 ENCOUNTER — Encounter: Admitting: Internal Medicine

## 2024-11-15 DIAGNOSIS — Z1231 Encounter for screening mammogram for malignant neoplasm of breast: Secondary | ICD-10-CM
